# Patient Record
Sex: Female | Born: 1940 | Race: White | Hispanic: No | Marital: Single | Smoking: Current every day smoker
Health system: Southern US, Community
[De-identification: ages and names within clinical notes are randomized; demographics above are authoritative.]

## PROBLEM LIST (undated history)

## (undated) DIAGNOSIS — I10 Essential (primary) hypertension: Secondary | ICD-10-CM

## (undated) DIAGNOSIS — M8580 Other specified disorders of bone density and structure, unspecified site: Secondary | ICD-10-CM

## (undated) DIAGNOSIS — K649 Unspecified hemorrhoids: Secondary | ICD-10-CM

## (undated) DIAGNOSIS — F419 Anxiety disorder, unspecified: Secondary | ICD-10-CM

## (undated) DIAGNOSIS — K589 Irritable bowel syndrome without diarrhea: Secondary | ICD-10-CM

## (undated) DIAGNOSIS — E785 Hyperlipidemia, unspecified: Secondary | ICD-10-CM

## (undated) DIAGNOSIS — K469 Unspecified abdominal hernia without obstruction or gangrene: Secondary | ICD-10-CM

## (undated) HISTORY — PX: EYE SURGERY: SHX253

## (undated) HISTORY — DX: Other specified disorders of bone density and structure, unspecified site: M85.80

## (undated) HISTORY — DX: Anxiety disorder, unspecified: F41.9

## (undated) HISTORY — DX: Irritable bowel syndrome, unspecified: K58.9

## (undated) HISTORY — DX: Unspecified hemorrhoids: K64.9

## (undated) HISTORY — DX: Hyperlipidemia, unspecified: E78.5

## (undated) HISTORY — DX: Unspecified abdominal hernia without obstruction or gangrene: K46.9

## (undated) HISTORY — PX: NECK SURGERY: SHX720

---

## 2003-12-11 ENCOUNTER — Emergency Department (HOSPITAL_COMMUNITY): Admission: EM | Admit: 2003-12-11 | Discharge: 2003-12-11 | Payer: Self-pay | Admitting: Emergency Medicine

## 2005-06-11 ENCOUNTER — Emergency Department (HOSPITAL_COMMUNITY): Admission: EM | Admit: 2005-06-11 | Discharge: 2005-06-11 | Payer: Self-pay | Admitting: Emergency Medicine

## 2005-06-12 ENCOUNTER — Ambulatory Visit (HOSPITAL_COMMUNITY): Admission: RE | Admit: 2005-06-12 | Discharge: 2005-06-12 | Payer: Self-pay | Admitting: Emergency Medicine

## 2007-04-01 ENCOUNTER — Ambulatory Visit (HOSPITAL_COMMUNITY): Admission: RE | Admit: 2007-04-01 | Discharge: 2007-04-01 | Payer: Self-pay | Admitting: Family Medicine

## 2007-08-10 ENCOUNTER — Emergency Department (HOSPITAL_COMMUNITY): Admission: EM | Admit: 2007-08-10 | Discharge: 2007-08-10 | Payer: Self-pay | Admitting: Emergency Medicine

## 2007-08-26 ENCOUNTER — Encounter: Admission: RE | Admit: 2007-08-26 | Discharge: 2007-08-26 | Payer: Self-pay | Admitting: Orthopedic Surgery

## 2007-12-12 ENCOUNTER — Ambulatory Visit (HOSPITAL_COMMUNITY): Admission: RE | Admit: 2007-12-12 | Discharge: 2007-12-12 | Payer: Self-pay | Admitting: General Surgery

## 2008-12-17 ENCOUNTER — Ambulatory Visit (HOSPITAL_COMMUNITY): Admission: RE | Admit: 2008-12-17 | Discharge: 2008-12-17 | Payer: Self-pay | Admitting: General Surgery

## 2009-06-10 ENCOUNTER — Ambulatory Visit (HOSPITAL_COMMUNITY): Admission: RE | Admit: 2009-06-10 | Discharge: 2009-06-10 | Payer: Self-pay | Admitting: Family Medicine

## 2009-06-24 ENCOUNTER — Ambulatory Visit (HOSPITAL_COMMUNITY): Admission: RE | Admit: 2009-06-24 | Discharge: 2009-06-24 | Payer: Self-pay | Admitting: Family Medicine

## 2009-12-20 ENCOUNTER — Ambulatory Visit (HOSPITAL_COMMUNITY)
Admission: RE | Admit: 2009-12-20 | Discharge: 2009-12-20 | Payer: Self-pay | Source: Home / Self Care | Admitting: Hematology and Oncology

## 2010-12-22 ENCOUNTER — Ambulatory Visit (HOSPITAL_COMMUNITY)
Admission: RE | Admit: 2010-12-22 | Discharge: 2010-12-22 | Payer: Self-pay | Source: Home / Self Care | Attending: Family Medicine | Admitting: Family Medicine

## 2011-06-29 ENCOUNTER — Other Ambulatory Visit (HOSPITAL_COMMUNITY): Payer: Self-pay | Admitting: Family Medicine

## 2011-06-29 DIAGNOSIS — M8580 Other specified disorders of bone density and structure, unspecified site: Secondary | ICD-10-CM

## 2011-08-01 ENCOUNTER — Encounter (HOSPITAL_COMMUNITY): Payer: Self-pay

## 2011-08-01 ENCOUNTER — Other Ambulatory Visit (HOSPITAL_COMMUNITY): Payer: Self-pay | Admitting: Family Medicine

## 2011-08-01 ENCOUNTER — Other Ambulatory Visit (HOSPITAL_COMMUNITY): Payer: Self-pay

## 2011-08-01 ENCOUNTER — Ambulatory Visit (HOSPITAL_COMMUNITY)
Admission: RE | Admit: 2011-08-01 | Discharge: 2011-08-01 | Disposition: A | Discharge status: Home / Self Care | Payer: Medicare Other | Source: Ambulatory Visit | Attending: Family Medicine | Admitting: Family Medicine

## 2011-08-01 DIAGNOSIS — F17200 Nicotine dependence, unspecified, uncomplicated: Secondary | ICD-10-CM | POA: Insufficient documentation

## 2011-08-01 DIAGNOSIS — Z780 Asymptomatic menopausal state: Secondary | ICD-10-CM | POA: Insufficient documentation

## 2011-08-01 DIAGNOSIS — Z789 Other specified health status: Secondary | ICD-10-CM

## 2011-08-01 DIAGNOSIS — M899 Disorder of bone, unspecified: Secondary | ICD-10-CM | POA: Insufficient documentation

## 2011-08-01 DIAGNOSIS — J449 Chronic obstructive pulmonary disease, unspecified: Secondary | ICD-10-CM | POA: Insufficient documentation

## 2011-08-01 DIAGNOSIS — M8580 Other specified disorders of bone density and structure, unspecified site: Secondary | ICD-10-CM

## 2011-08-01 DIAGNOSIS — M949 Disorder of cartilage, unspecified: Secondary | ICD-10-CM | POA: Insufficient documentation

## 2011-08-01 DIAGNOSIS — J4489 Other specified chronic obstructive pulmonary disease: Secondary | ICD-10-CM | POA: Insufficient documentation

## 2011-09-08 LAB — I-STAT 8, (EC8 V) (CONVERTED LAB)
Acid-Base Excess: 6 — ABNORMAL HIGH
Bicarbonate: 34.4 — ABNORMAL HIGH
Potassium: 4.2
TCO2: 36
pCO2, Ven: 63.1 — ABNORMAL HIGH
pH, Ven: 7.344 — ABNORMAL HIGH

## 2012-01-09 ENCOUNTER — Other Ambulatory Visit (HOSPITAL_COMMUNITY): Payer: Self-pay | Admitting: Family Medicine

## 2012-01-09 DIAGNOSIS — Z139 Encounter for screening, unspecified: Secondary | ICD-10-CM

## 2012-01-11 ENCOUNTER — Ambulatory Visit (HOSPITAL_COMMUNITY)
Admission: RE | Admit: 2012-01-11 | Discharge: 2012-01-11 | Disposition: A | Discharge status: Home / Self Care | Payer: Medicare Other | Source: Ambulatory Visit | Attending: Family Medicine | Admitting: Family Medicine

## 2012-01-11 DIAGNOSIS — Z1231 Encounter for screening mammogram for malignant neoplasm of breast: Secondary | ICD-10-CM | POA: Insufficient documentation

## 2012-01-11 DIAGNOSIS — Z139 Encounter for screening, unspecified: Secondary | ICD-10-CM

## 2012-03-06 ENCOUNTER — Emergency Department (HOSPITAL_COMMUNITY)
Admission: EM | Admit: 2012-03-06 | Discharge: 2012-03-07 | Disposition: A | Discharge status: Home / Self Care | Payer: Medicare Other | Attending: Emergency Medicine | Admitting: Emergency Medicine

## 2012-03-06 ENCOUNTER — Emergency Department (HOSPITAL_COMMUNITY): Payer: Medicare Other

## 2012-03-06 ENCOUNTER — Encounter (HOSPITAL_COMMUNITY): Payer: Self-pay | Admitting: *Deleted

## 2012-03-06 DIAGNOSIS — R109 Unspecified abdominal pain: Secondary | ICD-10-CM | POA: Insufficient documentation

## 2012-03-06 DIAGNOSIS — R110 Nausea: Secondary | ICD-10-CM | POA: Insufficient documentation

## 2012-03-06 DIAGNOSIS — R319 Hematuria, unspecified: Secondary | ICD-10-CM | POA: Insufficient documentation

## 2012-03-06 DIAGNOSIS — Z79899 Other long term (current) drug therapy: Secondary | ICD-10-CM | POA: Insufficient documentation

## 2012-03-06 DIAGNOSIS — R339 Retention of urine, unspecified: Secondary | ICD-10-CM | POA: Insufficient documentation

## 2012-03-06 LAB — URINALYSIS, ROUTINE W REFLEX MICROSCOPIC
Ketones, ur: NEGATIVE mg/dL
Leukocytes, UA: NEGATIVE
Nitrite: NEGATIVE
Urobilinogen, UA: 0.2 mg/dL (ref 0.0–1.0)
pH: 5.5 (ref 5.0–8.0)

## 2012-03-06 LAB — COMPREHENSIVE METABOLIC PANEL
Alkaline Phosphatase: 79 U/L (ref 39–117)
BUN: 19 mg/dL (ref 6–23)
Calcium: 11 mg/dL — ABNORMAL HIGH (ref 8.4–10.5)
GFR calc Af Amer: 83 mL/min — ABNORMAL LOW (ref >90)
GFR calc non Af Amer: 71 mL/min — ABNORMAL LOW (ref >90)
Glucose, Bld: 135 mg/dL — ABNORMAL HIGH (ref 70–99)
Potassium: 3.9 mEq/L (ref 3.5–5.1)
Total Protein: 8.3 g/dL (ref 6.0–8.3)

## 2012-03-06 LAB — URINE MICROSCOPIC-ADD ON

## 2012-03-06 LAB — DIFFERENTIAL
Eosinophils Absolute: 0 10*3/uL (ref 0.0–0.7)
Eosinophils Relative: 0 % (ref 0–5)
Lymphs Abs: 2.3 10*3/uL (ref 0.7–4.0)
Monocytes Relative: 6 % (ref 3–12)

## 2012-03-06 LAB — CBC
HCT: 48.1 % — ABNORMAL HIGH (ref 36.0–46.0)
Hemoglobin: 15.9 g/dL — ABNORMAL HIGH (ref 12.0–15.0)
MCH: 30.1 pg (ref 26.0–34.0)
MCV: 90.9 fL (ref 78.0–100.0)
RBC: 5.29 MIL/uL — ABNORMAL HIGH (ref 3.87–5.11)

## 2012-03-06 MED ORDER — CIPROFLOXACIN IN D5W 400 MG/200ML IV SOLN
400.0000 mg | Freq: Once | INTRAVENOUS | Status: AC
  Administered 2012-03-07: 400 mg via INTRAVENOUS
  Filled 2012-03-06: qty 200

## 2012-03-06 MED ORDER — SODIUM CHLORIDE 0.9 % IV BOLUS (SEPSIS)
1000.0000 mL | Freq: Once | INTRAVENOUS | Status: AC
  Administered 2012-03-06: 1000 mL via INTRAVENOUS

## 2012-03-06 MED ORDER — ONDANSETRON HCL 4 MG/2ML IJ SOLN
4.0000 mg | Freq: Once | INTRAMUSCULAR | Status: AC
  Administered 2012-03-07: 4 mg via INTRAVENOUS
  Filled 2012-03-06: qty 2

## 2012-03-06 MED ORDER — SODIUM CHLORIDE 0.9 % IV SOLN: INTRAVENOUS | Status: DC

## 2012-03-06 NOTE — ED Provider Notes (Signed)
History     CSN: [id number]  Arrival date & time 03/06/12  1909   First MD Initiated Contact with Patient 03/06/12 2312      Chief Complaint  Patient presents with  . Abdominal Pain    (Consider location/radiation/quality/duration/timing/severity/associated sxs/prior treatment) HPI Valerie Marshall is a 71 y.o. female who presents to the Emergency Department complaining of Lower abdominal pain that began last night and got worse today. It has been associated with urinary retention. Patient had cataract surgery today and has only voided once, a small amount, since surgery. She has experienced mild nausea, no vomiting. She denies fever, chills, cough, shortness of breath.  PCP Dr. Renard Matter Opthalmologist Dr. Nile Riggs   History reviewed. No pertinent past medical history.  History reviewed. No pertinent past surgical history.  No family history on file.  History  Substance Use Topics  . Smoking status: Not on file  . Smokeless tobacco: Not on file  . Alcohol Use: Not on file    OB History    Grav Para Term Preterm Abortions TAB SAB Ect Mult Living                  Review of Systems ROS: Statement: All systems negative except as marked or noted in the HPI; Constitutional: Negative for fever and chills. ; ; Eyes: Negative for eye pain, redness and discharge. ; ; ENMT: Negative for ear pain, hoarseness, nasal congestion, sinus pressure and sore throat. ; ; Cardiovascular: Negative for chest pain, palpitations, diaphoresis, dyspnea and peripheral edema. ; ; Respiratory: Negative for cough, wheezing and stridor. ; ; Gastrointestinal: Negative for  vomiting, diarrhea, abdominal pain, blood in stool, hematemesis, jaundice and rectal bleeding. . ; ; Genitourinary: Negative for dysuria, flank pain and hematuria. ; ; Musculoskeletal: Negative for back pain and neck pain. Negative for swelling and trauma.; ; Skin: Negative for pruritus, rash, abrasions, blisters, bruising and skin lesion.;  ; Neuro: Negative for headache, lightheadedness and neck stiffness. Negative for weakness, altered level of consciousness , altered mental status, extremity weakness, paresthesias, involuntary movement, seizure and syncope.     Allergies  Bee venom and Codeine  Home Medications   Current Outpatient Rx  Name Route Sig Dispense Refill  . ACETAMINOPHEN 500 MG PO TABS Oral Take 500 mg by mouth every 6 (six) hours as needed. For eye pain    . DOXYCYCLINE HYCLATE 100 MG PO TABS Oral Take 100 mg by mouth 2 (two) times daily. On an empty stomach    . FENOFIBRATE MICRONIZED 130 MG PO CAPS Oral Take 130 mg by mouth daily before breakfast.    . FUROSEMIDE 40 MG PO TABS Oral Take 40 mg by mouth daily.    Marland Kitchen ROSUVASTATIN CALCIUM 20 MG PO TABS Oral Take 20 mg by mouth daily.      BP 151/67  Pulse 90  Temp(Src) 98 F (36.7 C) (Oral)  Resp 16  SpO2 96%  Physical Exam Physical examination:  Nursing notes reviewed; Vital signs and O2 SAT reviewed;  Constitutional: Well developed, Well nourished, Well hydrated, In no acute distress; Head:  Normocephalic, atraumatic; Eyes: EOMI, PERRL, No scleral icterus;Eye shield over left eye ENMT: Mouth and pharynx normal, Mucous membranes moist; Neck: Supple, Full range of motion, No lymphadenopathy; Cardiovascular: Regular rate and rhythm, No murmur, rub, or gallop; Respiratory: Breath sounds clear & equal bilaterally, No rales, rhonchi, wheezes, or rub, Normal respiratory effort/excursion; Chest: Nontender, Movement normal; Abdomen: Soft, mild suprapubic tenderness to palpation,, Nondistended, Normal bowel  sounds; Genitourinary: No CVA tenderness; Extremities: Pulses normal, No tenderness, No edema, No calf edema or asymmetry.; Neuro: AA&Ox3, Major CN grossly intact.  No gross focal motor or sensory deficits in extremities.; Skin: Color normal, Warm, Dry  ED Course  Procedures (including critical care time)  Results for orders placed during the hospital encounter of  03/06/12  CBC      Component Value Range   WBC 14.4 (*) 4.0 - 10.5 (K/uL)   RBC 5.29 (*) 3.87 - 5.11 (MIL/uL)   Hemoglobin 15.9 (*) 12.0 - 15.0 (g/dL)   HCT 40.9 (*) 81.1 - 46.0 (%)   MCV 90.9  78.0 - 100.0 (fL)   MCH 30.1  26.0 - 34.0 (pg)   MCHC 33.1  30.0 - 36.0 (g/dL)   RDW 91.4  78.2 - 95.6 (%)   Platelets 298  150 - 400 (K/uL)  DIFFERENTIAL      Component Value Range   Neutrophils Relative 78 (*) 43 - 77 (%)   Neutro Abs 11.3 (*) 1.7 - 7.7 (K/uL)   Lymphocytes Relative 16  12 - 46 (%)   Lymphs Abs 2.3  0.7 - 4.0 (K/uL)   Monocytes Relative 6  3 - 12 (%)   Monocytes Absolute 0.8  0.1 - 1.0 (K/uL)   Eosinophils Relative 0  0 - 5 (%)   Eosinophils Absolute 0.0  0.0 - 0.7 (K/uL)   Basophils Relative 0  0 - 1 (%)   Basophils Absolute 0.0  0.0 - 0.1 (K/uL)  COMPREHENSIVE METABOLIC PANEL      Component Value Range   Sodium 138  135 - 145 (mEq/L)   Potassium 3.9  3.5 - 5.1 (mEq/L)   Chloride 100  96 - 112 (mEq/L)   CO2 26  19 - 32 (mEq/L)   Glucose, Bld 135 (*) 70 - 99 (mg/dL)   BUN 19  6 - 23 (mg/dL)   Creatinine, Ser 2.13  0.50 - 1.10 (mg/dL)   Calcium 08.6 (*) 8.4 - 10.5 (mg/dL)   Total Protein 8.3  6.0 - 8.3 (g/dL)   Albumin 4.3  3.5 - 5.2 (g/dL)   AST 25  0 - 37 (U/L)   ALT 19  0 - 35 (U/L)   Alkaline Phosphatase 79  39 - 117 (U/L)   Total Bilirubin 0.5  0.3 - 1.2 (mg/dL)   GFR calc non Af Amer 71 (*) >90 (mL/min)   GFR calc Af Amer 83 (*) >90 (mL/min)  URINALYSIS, ROUTINE W REFLEX MICROSCOPIC      Component Value Range   Color, Urine YELLOW  YELLOW    APPearance CLEAR  CLEAR    Specific Gravity, Urine >1.030 (*) 1.005 - 1.030    pH 5.5  5.0 - 8.0    Glucose, UA NEGATIVE  NEGATIVE (mg/dL)   Hgb urine dipstick LARGE (*) NEGATIVE    Bilirubin Urine NEGATIVE  NEGATIVE    Ketones, ur NEGATIVE  NEGATIVE (mg/dL)   Protein, ur NEGATIVE  NEGATIVE (mg/dL)   Urobilinogen, UA 0.2  0.0 - 1.0 (mg/dL)   Nitrite NEGATIVE  NEGATIVE    Leukocytes, UA NEGATIVE  NEGATIVE     LIPASE, BLOOD      Component Value Range   Lipase 40  11 - 59 (U/L)  URINE MICROSCOPIC-ADD ON      Component Value Range   Squamous Epithelial / LPF RARE  RARE    WBC, UA 0-2  <3 (WBC/hpf)   RBC / HPF TOO NUMEROUS TO COUNT  <  3 (RBC/hpf)   Bacteria, UA FEW (*) RARE    Crystals CA OXALATE CRYSTALS (*) NEGATIVE    Dg Abd Acute W/chest  03/06/2012  *RADIOLOGY REPORT*  Clinical Data: Chest discomfort and diffuse abdominal pain.  ACUTE ABDOMEN SERIES (ABDOMEN 2 VIEW & CHEST 1 VIEW)  Comparison: Chest radiograph performed 08/01/2011  Findings: The lungs are hyperexpanded, with flattening of the hemidiaphragms, compatible with COPD.  Chronically increased interstitial markings are noted.  There is no evidence of focal opacification, pleural effusion or pneumothorax.  The cardiomediastinal silhouette is normal in size; calcification is noted at the aortic arch.  The visualized bowel gas pattern is unremarkable.  Scattered stool and air are seen within the colon; there is no evidence of small bowel dilatation to suggest obstruction.  No free intra-abdominal air is identified on the provided upright view.  No acute osseous abnormalities are seen; the sacroiliac joints are unremarkable in appearance.  IMPRESSION:  1.  Unremarkable bowel gas pattern; no free intra-abdominal air seen. 2.  No acute cardiopulmonary process identified; findings of COPD again noted.  Original Report Authenticated By: Tonia Ghent, M.D.       MDM  Patient with lower abdominal pain and urinary retention since last night. Patient had cataract surgery earlier today. Labs with a elevated white count and left shift and urine with a large amount of blood. Patient has no flank pain to suggest a kidney stone. We will treat empirically with antibiotics, and patient will followup with her primary care physician. Given IV fluids, analgesics, enzymatic, and antibiotic. Patient feels better.Pt stable in ED with no significant deterioration in  condition.The patient appears reasonably screened and/or stabilized for discharge and I doubt any other medical condition or other [facility] requiring further screening, evaluation, or treatment in the ED at this time prior to discharge.  MDM Reviewed: previous chart, nursing note and vitals Interpretation: labs          Nicoletta Dress. Colon Branch, MD 03/07/12 0110

## 2012-03-06 NOTE — ED Notes (Signed)
Abdominal pain , states she has swelling but decreased urine output

## 2012-03-07 MED ORDER — CIPROFLOXACIN HCL 250 MG PO TABS: 250.0000 mg | ORAL_TABLET | Freq: Two times a day (BID) | ORAL | Status: AC

## 2012-03-07 MED ORDER — ONDANSETRON HCL 4 MG PO TABS: 4.0000 mg | ORAL_TABLET | Freq: Four times a day (QID) | ORAL | Status: AC

## 2012-03-07 NOTE — Discharge Instructions (Signed)
YOur lab work here tonight shows blood in the urine which is most likely the beginning of a urinary tract infection. We have given you antibiotics. Drink lots of fluids. Take all of the antibiotic. Use the nausea medicine as needed. Follow up with Dr. Renard Matter.

## 2012-03-07 NOTE — ED Notes (Signed)
Pt alert & oriented x4, stable gait. Pt given discharge instructions, paperwork & prescription(s). Patient instructed to stop at the registration desk to finish any additional paperwork. pt verbalized understanding. Pt left department w/ no further questions.  

## 2012-08-21 ENCOUNTER — Other Ambulatory Visit (HOSPITAL_COMMUNITY): Payer: Self-pay | Admitting: Family Medicine

## 2012-08-21 ENCOUNTER — Ambulatory Visit (HOSPITAL_COMMUNITY)
Admission: RE | Admit: 2012-08-21 | Discharge: 2012-08-21 | Disposition: A | Discharge status: Home / Self Care | Payer: Medicare Other | Source: Ambulatory Visit | Attending: Family Medicine | Admitting: Family Medicine

## 2012-08-21 DIAGNOSIS — G8929 Other chronic pain: Secondary | ICD-10-CM

## 2012-08-21 DIAGNOSIS — M25569 Pain in unspecified knee: Secondary | ICD-10-CM | POA: Insufficient documentation

## 2012-10-02 ENCOUNTER — Other Ambulatory Visit (HOSPITAL_COMMUNITY): Payer: Self-pay | Admitting: Family Medicine

## 2012-10-02 ENCOUNTER — Ambulatory Visit (HOSPITAL_COMMUNITY)
Admission: RE | Admit: 2012-10-02 | Discharge: 2012-10-02 | Disposition: A | Discharge status: Home / Self Care | Payer: Medicare Other | Source: Ambulatory Visit | Attending: Family Medicine | Admitting: Family Medicine

## 2012-10-02 DIAGNOSIS — F17200 Nicotine dependence, unspecified, uncomplicated: Secondary | ICD-10-CM

## 2012-12-31 ENCOUNTER — Other Ambulatory Visit (HOSPITAL_COMMUNITY): Payer: Self-pay | Admitting: Family Medicine

## 2012-12-31 DIAGNOSIS — Z139 Encounter for screening, unspecified: Secondary | ICD-10-CM

## 2013-01-13 ENCOUNTER — Ambulatory Visit (HOSPITAL_COMMUNITY)
Admission: RE | Admit: 2013-01-13 | Discharge: 2013-01-13 | Disposition: A | Discharge status: Home / Self Care | Payer: Medicare Other | Source: Ambulatory Visit | Attending: Family Medicine | Admitting: Family Medicine

## 2013-01-13 DIAGNOSIS — Z139 Encounter for screening, unspecified: Secondary | ICD-10-CM

## 2013-01-13 DIAGNOSIS — Z1231 Encounter for screening mammogram for malignant neoplasm of breast: Secondary | ICD-10-CM | POA: Insufficient documentation

## 2013-01-17 ENCOUNTER — Other Ambulatory Visit: Payer: Self-pay | Admitting: Family Medicine

## 2013-01-17 ENCOUNTER — Other Ambulatory Visit (HOSPITAL_COMMUNITY): Payer: Self-pay | Admitting: Family Medicine

## 2013-01-17 DIAGNOSIS — R928 Other abnormal and inconclusive findings on diagnostic imaging of breast: Secondary | ICD-10-CM

## 2013-01-29 ENCOUNTER — Other Ambulatory Visit (HOSPITAL_COMMUNITY): Payer: Self-pay | Admitting: Family Medicine

## 2013-01-29 ENCOUNTER — Ambulatory Visit (HOSPITAL_COMMUNITY)
Admission: RE | Admit: 2013-01-29 | Discharge: 2013-01-29 | Disposition: A | Discharge status: Home / Self Care | Payer: Medicare Other | Source: Ambulatory Visit | Attending: Family Medicine | Admitting: Family Medicine

## 2013-01-29 DIAGNOSIS — R928 Other abnormal and inconclusive findings on diagnostic imaging of breast: Secondary | ICD-10-CM | POA: Insufficient documentation

## 2014-03-09 ENCOUNTER — Ambulatory Visit (HOSPITAL_COMMUNITY)
Admission: RE | Admit: 2014-03-09 | Discharge: 2014-03-09 | Disposition: A | Discharge status: Home / Self Care | Payer: Medicare Other | Source: Ambulatory Visit | Attending: Family Medicine | Admitting: Family Medicine

## 2014-03-09 ENCOUNTER — Other Ambulatory Visit (HOSPITAL_COMMUNITY): Payer: Self-pay | Admitting: Family Medicine

## 2014-03-09 DIAGNOSIS — F17200 Nicotine dependence, unspecified, uncomplicated: Secondary | ICD-10-CM

## 2014-07-16 ENCOUNTER — Encounter: Payer: Self-pay | Admitting: Internal Medicine

## 2014-09-10 ENCOUNTER — Encounter: Payer: Self-pay | Admitting: Internal Medicine

## 2014-09-15 ENCOUNTER — Ambulatory Visit: Payer: Medicare Other | Admitting: Internal Medicine

## 2014-11-13 ENCOUNTER — Ambulatory Visit (INDEPENDENT_AMBULATORY_CARE_PROVIDER_SITE_OTHER): Payer: Medicare Other | Admitting: Internal Medicine

## 2014-11-13 ENCOUNTER — Encounter: Payer: Self-pay | Admitting: Internal Medicine

## 2014-11-13 VITALS — BP 158/84 | HR 100 | Ht 63.5 in | Wt 172.5 lb

## 2014-11-13 DIAGNOSIS — R634 Abnormal weight loss: Secondary | ICD-10-CM

## 2014-11-13 DIAGNOSIS — Z1211 Encounter for screening for malignant neoplasm of colon: Secondary | ICD-10-CM

## 2014-11-13 NOTE — Patient Instructions (Addendum)
Cologuard stool test was ordered today. You will receive a call from [facility] who administers the test with instructions on how to do the stool test and mail it back to them.  Information on Cologuard was given today please read over it.   I appreciate the opportunity to care for you.

## 2014-11-13 NOTE — Progress Notes (Signed)
Patient ID: Valerie RandyDorothy G Marshall, female   DOB: 06-May-1941, 73 y.o.   MRN: [id number]    HPI:    Valerie CellaDorothy is a 73 year old female who was self-referred for colon cancer screening. She has not had any change in her bowel habits or stool caliber. She was diagnosed with IBS 40 years ago at another practice and states she tries to watch what she eats to regulate her stools. She has had no bright red blood per rectum or melena. Her appetite is good. She has lost 28 pounds over the past year, but she states she used to drink two 2 L bottles of Dr. Reino KentPepper daily and eat junk food. Last January she eliminated all soda, junk food, bread, and pasta from her diet. She has had no heartburn, epigastric pain, nausea, vomiting, or dysphagia. There is no known family history of colon cancer, colon polyps, or inflammatory bowel disease.   Past Medical History  Diagnosis Date  . Hyperlipidemia   . Osteopenia   . Anxiety   . Irritable bowel syndrome   . Hernia of abdominal cavity   . Hemorrhoids     Past Surgical History  Procedure Laterality Date  . Neck surgery     Family History  Problem Relation Age of Onset  . Breast cancer Sister   . Diabetes Sister   . Colon cancer Neg Hx   . Colon polyps Neg Hx   . Kidney disease Neg Hx   . Esophageal cancer Neg Hx   . Gallbladder disease Neg Hx    History  Substance Use Topics  . Smoking status: Current Every Day Smoker -- 0.50 packs/day for 45 years    Types: Cigarettes  . Smokeless tobacco: Never Used  . Alcohol Use: No   Current Outpatient Prescriptions  Medication Sig Dispense Refill  . acetaminophen (TYLENOL) 500 MG tablet Take 500 mg by mouth every 6 (six) hours as needed. For eye pain    . clorazepate (TRANXENE) 7.5 MG tablet Take 7.5 mg by mouth daily.    . fenofibrate (TRICOR) 145 MG tablet     . furosemide (LASIX) 40 MG tablet Take 40 mg by mouth as needed.     . rosuvastatin (CRESTOR) 20 MG tablet Take 20 mg by mouth daily.     No current  facility-administered medications for this visit.   Allergies  Allergen Reactions  . Bee Venom Swelling  . Codeine Other (See Comments)    Nervousness, Hallucinations     Review of Systems: Gen: Denies any fever, chills, sweats, anorexia, fatigue, weakness, malaise, and sleep disorder CV: Denies chest pain, angina, palpitations, syncope, orthopnea, PND, peripheral edema, and claudication. Resp: Denies dyspnea at rest, dyspnea with exercise, cough, sputum, wheezing, coughing up blood, and pleurisy. GI: Denies vomiting blood, jaundice, and fecal incontinence.   Denies dysphagia or odynophagia. GU : Denies urinary burning, blood in urine, urinary frequency, urinary hesitancy, nocturnal urination, and urinary incontinence. MS: Denies joint pain, limitation of movement, and swelling, stiffness, low back pain, extremity pain. Denies muscle weakness, cramps, atrophy.  Derm: Denies rash, itching, dry skin, hives, moles, warts, or unhealing ulcers.  Psych: Denies depression, anxiety, memory loss, suicidal ideation, hallucinations, paranoia, and confusion. Heme: Denies bruising, bleeding, and enlarged lymph nodes. Neuro:  Denies any headaches, dizziness, paresthesias. Endo:  Denies any problems with DM, thyroid, adrenal function    Physical Exam: BP 158/84 mmHg  Pulse 100  Ht 5' 3.5" (1.613 m)  Wt 172 lb 8 oz (78.245 kg)  BMI 30.07 kg/m2 Constitutional: Pleasant,well-developed female in no acute distress. HEENT: Normocephalic and atraumatic. Conjunctivae are normal. No scleral icterus. Neck supple.  Cardiovascular: Normal rate, regular rhythm.  Pulmonary/chest: Effort normal and breath sounds normal. No wheezing, rales or rhonchi. Abdominal: Soft, nondistended, nontender. Bowel sounds active throughout. There are no masses palpable. No hepatomegaly. Extremities: no edema Lymphadenopathy: No cervical adenopathy noted. Neurological: Alert and oriented to person place and time. Skin: Skin  is warm and dry. No rashes noted. Psychiatric: Normal mood and affect. Behavior is normal.  ASSESSMENT AND PLAN: Asymptomatic 73 year old female who has experienced purposeful weight loss over the past year, here to discuss colorectal cancer screening. We have discussed the options of traditional colonoscopy versus cold guard. She states her brother-in-law recently did colo guard, and she prefers to have the colo guard test. She understands that if the colo guard is positive, she will need to proceed with a traditional colonoscopy. She also understands that if the colo guard is negative, she would likely need to repeat it in 3 or 4 years.    Ashlee Player, Tollie PizzaLori P PA-C 11/13/2014, 2:09 PM    Agree w/ Ms. Valerie Marshall's note and mangement.        Iva Booparl E. Gessner, MD, Clementeen GrahamFACG

## 2015-05-25 ENCOUNTER — Other Ambulatory Visit (HOSPITAL_COMMUNITY): Payer: Self-pay | Admitting: Family Medicine

## 2015-05-25 DIAGNOSIS — Z1231 Encounter for screening mammogram for malignant neoplasm of breast: Secondary | ICD-10-CM

## 2015-06-03 ENCOUNTER — Ambulatory Visit (HOSPITAL_COMMUNITY)
Admission: RE | Admit: 2015-06-03 | Discharge: 2015-06-03 | Disposition: A | Discharge status: Home / Self Care | Payer: Medicare Other | Source: Ambulatory Visit | Attending: Family Medicine | Admitting: Family Medicine

## 2015-06-03 ENCOUNTER — Other Ambulatory Visit (HOSPITAL_COMMUNITY): Payer: Self-pay | Admitting: Family Medicine

## 2015-06-03 DIAGNOSIS — F17210 Nicotine dependence, cigarettes, uncomplicated: Secondary | ICD-10-CM | POA: Insufficient documentation

## 2015-06-03 DIAGNOSIS — Z1231 Encounter for screening mammogram for malignant neoplasm of breast: Secondary | ICD-10-CM

## 2015-06-03 DIAGNOSIS — J449 Chronic obstructive pulmonary disease, unspecified: Secondary | ICD-10-CM | POA: Diagnosis not present

## 2016-01-13 ENCOUNTER — Other Ambulatory Visit (HOSPITAL_COMMUNITY): Payer: Self-pay | Admitting: Internal Medicine

## 2016-01-13 ENCOUNTER — Ambulatory Visit (HOSPITAL_COMMUNITY)
Admission: RE | Admit: 2016-01-13 | Discharge: 2016-01-13 | Disposition: A | Discharge status: Home / Self Care | Payer: Medicare Other | Source: Ambulatory Visit | Attending: Internal Medicine | Admitting: Internal Medicine

## 2016-01-13 DIAGNOSIS — M8580 Other specified disorders of bone density and structure, unspecified site: Secondary | ICD-10-CM

## 2016-01-13 DIAGNOSIS — M85852 Other specified disorders of bone density and structure, left thigh: Secondary | ICD-10-CM | POA: Diagnosis present

## 2016-05-31 ENCOUNTER — Other Ambulatory Visit (HOSPITAL_COMMUNITY): Payer: Self-pay | Admitting: Internal Medicine

## 2016-05-31 DIAGNOSIS — Z1231 Encounter for screening mammogram for malignant neoplasm of breast: Secondary | ICD-10-CM

## 2016-06-07 ENCOUNTER — Ambulatory Visit (HOSPITAL_COMMUNITY)
Admission: RE | Admit: 2016-06-07 | Discharge: 2016-06-07 | Disposition: A | Discharge status: Home / Self Care | Payer: Medicare Other | Source: Ambulatory Visit | Attending: Internal Medicine | Admitting: Internal Medicine

## 2016-06-07 DIAGNOSIS — Z1231 Encounter for screening mammogram for malignant neoplasm of breast: Secondary | ICD-10-CM

## 2017-04-26 ENCOUNTER — Other Ambulatory Visit (HOSPITAL_COMMUNITY): Payer: Self-pay | Admitting: Internal Medicine

## 2017-04-26 DIAGNOSIS — Z1231 Encounter for screening mammogram for malignant neoplasm of breast: Secondary | ICD-10-CM

## 2017-05-16 ENCOUNTER — Emergency Department (HOSPITAL_COMMUNITY)
Admission: EM | Admit: 2017-05-16 | Discharge: 2017-05-16 | Disposition: A | Discharge status: Home Health | Payer: Medicare Other | Attending: Emergency Medicine | Admitting: Emergency Medicine

## 2017-05-16 ENCOUNTER — Encounter (HOSPITAL_COMMUNITY): Payer: Self-pay | Admitting: Emergency Medicine

## 2017-05-16 ENCOUNTER — Emergency Department (HOSPITAL_COMMUNITY): Payer: Medicare Other

## 2017-05-16 DIAGNOSIS — S300XXA Contusion of lower back and pelvis, initial encounter: Secondary | ICD-10-CM | POA: Insufficient documentation

## 2017-05-16 DIAGNOSIS — Y929 Unspecified place or not applicable: Secondary | ICD-10-CM | POA: Insufficient documentation

## 2017-05-16 DIAGNOSIS — Y999 Unspecified external cause status: Secondary | ICD-10-CM | POA: Diagnosis not present

## 2017-05-16 DIAGNOSIS — M5136 Other intervertebral disc degeneration, lumbar region: Secondary | ICD-10-CM | POA: Insufficient documentation

## 2017-05-16 DIAGNOSIS — Y939 Activity, unspecified: Secondary | ICD-10-CM | POA: Insufficient documentation

## 2017-05-16 DIAGNOSIS — W19XXXA Unspecified fall, initial encounter: Secondary | ICD-10-CM | POA: Diagnosis not present

## 2017-05-16 DIAGNOSIS — F17210 Nicotine dependence, cigarettes, uncomplicated: Secondary | ICD-10-CM | POA: Insufficient documentation

## 2017-05-16 DIAGNOSIS — M47817 Spondylosis without myelopathy or radiculopathy, lumbosacral region: Secondary | ICD-10-CM

## 2017-05-16 DIAGNOSIS — I10 Essential (primary) hypertension: Secondary | ICD-10-CM | POA: Diagnosis not present

## 2017-05-16 DIAGNOSIS — S3992XA Unspecified injury of lower back, initial encounter: Secondary | ICD-10-CM | POA: Diagnosis present

## 2017-05-16 HISTORY — DX: Essential (primary) hypertension: I10

## 2017-05-16 MED ORDER — METHOCARBAMOL 500 MG PO TABS
500.0000 mg | ORAL_TABLET | Freq: Once | ORAL | Status: AC
  Administered 2017-05-16: 500 mg via ORAL
  Filled 2017-05-16: qty 1

## 2017-05-16 MED ORDER — ONDANSETRON HCL 4 MG PO TABS
4.0000 mg | ORAL_TABLET | Freq: Once | ORAL | Status: AC
  Administered 2017-05-16: 4 mg via ORAL
  Filled 2017-05-16: qty 1

## 2017-05-16 MED ORDER — METHOCARBAMOL 500 MG PO TABS: 500.0000 mg | ORAL_TABLET | Freq: Two times a day (BID) | ORAL | 0 refills | Status: DC

## 2017-05-16 MED ORDER — HYDROCODONE-ACETAMINOPHEN 5-325 MG PO TABS: 1.0000 | ORAL_TABLET | Freq: Four times a day (QID) | ORAL | 0 refills | Status: DC | PRN

## 2017-05-16 MED ORDER — MELOXICAM 7.5 MG PO TABS: 7.5000 mg | ORAL_TABLET | Freq: Two times a day (BID) | ORAL | 0 refills | Status: DC

## 2017-05-16 MED ORDER — HYDROCODONE-ACETAMINOPHEN 5-325 MG PO TABS
2.0000 | ORAL_TABLET | Freq: Once | ORAL | Status: AC
  Administered 2017-05-16: 2 via ORAL
  Filled 2017-05-16: qty 2

## 2017-05-16 NOTE — ED Triage Notes (Signed)
Pt fell a week ago on her buttocks. Pt states " my tailbone is hurting" no head injury.

## 2017-05-16 NOTE — Discharge Instructions (Signed)
Your x-rays are negative for fracture or dislocation. There are multiple areas of arthritis and degenerative disc disease. Your examination is consistent with contusion or bruising to your back following the fall. Please discuss her symptoms with Dr. Selena BattenKim. The two-view can then decide if an MRI is needed. Please use Mobic 2 times daily with a meal. Use Robaxin 2 times daily, use Norco for more severe pain. Norco and Robaxin may cause drowsiness, please use these medications with caution.

## 2017-05-16 NOTE — ED Provider Notes (Signed)
[facility]-EM[facility]Y DEPT Provider Note   CSN: [id number] Arrival date & time: 05/16/17  1356     History   Chief Complaint Chief Complaint  Patient presents with  . Fall    HPI Valerie Marshall is a 76 y.o. female.  Patient is a 76 year old female who presents to the emergency department with a complaint of back pain following a fall.  The patient states that approximately a week ago she fell from a standing position and fell on her buttocks. She states since that time her quotation marks tailbone" has been hurting. She did not hit her head or have any other injury at the time. She did not see a physician right away. She did see a physician a few days ago and was given medication for pain. The patient states she is still having discomfort and the pain medicine helps very minimally. She wanted to be evaluated, she wanted to have x-rays, and to be examined. His been no loss of bowel or bladder function. No other falls reported since the fall over a week ago.      Past Medical History:  Diagnosis Date  . Anxiety   . Hemorrhoids   . Hernia of abdominal cavity   . Hyperlipidemia   . Hypertension   . Irritable bowel syndrome   . Osteopenia     There are no active problems to display for this patient.   Past Surgical History:  Procedure Laterality Date  . NECK SURGERY      OB History    No data available       Home Medications    Prior to Admission medications   Medication Sig Start Date End Date Taking? Authorizing Provider  acetaminophen (TYLENOL) 500 MG tablet Take 500 mg by mouth every 6 (six) hours as needed. For eye pain    [provider]  amLODipine (NORVASC) 2.5 MG tablet Take 2.5 mg by mouth daily. 04/05/17   [provider]  clorazepate (TRANXENE) 7.5 MG tablet Take 7.5 mg by mouth daily.    [provider]  fenofibrate (TRICOR) 145 MG tablet  08/17/14   [provider]  furosemide (LASIX) 40 MG tablet Take 40 mg by mouth as  needed.     [provider]  hydrochlorothiazide (HYDRODIURIL) 25 MG tablet Take 25 mg by mouth daily. 03/01/17   [provider]  HYDROcodone-acetaminophen (NORCO/VICODIN) 5-325 MG tablet Take 1 tablet by mouth 2 (two) times daily as needed. 05/11/17   [provider]  lisinopril (PRINIVIL,ZESTRIL) 20 MG tablet Take 20 mg by mouth daily. 05/09/17   [provider]  lisinopril (PRINIVIL,ZESTRIL) 5 MG tablet Take 5 mg by mouth daily. 05/09/17   [provider]  rosuvastatin (CRESTOR) 20 MG tablet Take 20 mg by mouth daily.    [provider]    Family History Family History  Problem Relation Age of Onset  . Breast cancer Sister   . Diabetes Sister   . Colon cancer Neg Hx   . Colon polyps Neg Hx   . Kidney disease Neg Hx   . Esophageal cancer Neg Hx   . Gallbladder disease Neg Hx     Social History Social History  Substance Use Topics  . Smoking status: Current Every Day Smoker    Packs/day: 0.50    Years: 45.00    Types: Cigarettes  . Smokeless tobacco: Never Used  . Alcohol use No     Allergies   Bee venom and Codeine  Review of Systems Review of Systems  Constitutional: Negative for activity change.       All ROS Neg except as noted in HPI  HENT: Negative for nosebleeds.   Eyes: Negative for photophobia and discharge.  Respiratory: Negative for cough, shortness of breath and wheezing.   Cardiovascular: Negative for chest pain and palpitations.  Gastrointestinal: Negative for abdominal pain and blood in stool.  Genitourinary: Negative for dysuria, frequency and hematuria.  Musculoskeletal: Positive for arthralgias and back pain. Negative for neck pain.  Skin: Negative.   Neurological: Negative for dizziness, seizures and speech difficulty.  Psychiatric/Behavioral: Negative for confusion and hallucinations.     Physical Exam Updated Vital Signs BP (!) 153/91 (BP Location: Right Arm)   Pulse 96   Temp 98.3 F  (36.8 C) (Oral)   Resp 18   Ht 5\' 6"  (1.676 m)   Wt 78.5 kg (173 lb)   SpO2 98%   BMI 27.92 kg/m   Physical Exam  Constitutional: She is oriented to person, place, and time. She appears well-developed and well-nourished.  Non-toxic appearance.  HENT:  Head: Normocephalic.  Right Ear: Tympanic membrane and external ear normal.  Left Ear: Tympanic membrane and external ear normal.  Eyes: EOM and lids are normal. Pupils are equal, round, and reactive to light.  Neck: Normal range of motion. Neck supple. Carotid bruit is not present.  Cardiovascular: Normal rate, regular rhythm, intact distal pulses and normal pulses.  Exam reveals no friction rub.   Murmur heard. Pulmonary/Chest: Breath sounds normal. No respiratory distress.  Abdominal: Soft. Bowel sounds are normal. There is no tenderness. There is no guarding.  Musculoskeletal: Normal range of motion.  There is pain to palpation of the lower lumbar spine. There is pain to palpation over the coccyx area. There is pain and some mild spasm of the paraspinal region at the lumbar area. No hot areas appreciated.  Lymphadenopathy:       Head (right side): No submandibular adenopathy present.       Head (left side): No submandibular adenopathy present.    She has no cervical adenopathy.  Neurological: She is alert and oriented to person, place, and time. She has normal strength. No cranial nerve deficit or sensory deficit.  No acute motor or sensory deficits appreciated.  Skin: Skin is warm and dry.  Psychiatric: She has a normal mood and affect. Her speech is normal.  Nursing note and vitals reviewed.    ED Treatments / Results  Labs (all labs ordered are listed, but only abnormal results are displayed) Labs Reviewed - No data to display  EKG  EKG Interpretation None       Radiology Dg Lumbar Spine Complete  Result Date: 05/16/2017 CLINICAL DATA:  Fall on tailbone EXAM: LUMBAR SPINE - COMPLETE 4+ VIEW COMPARISON:  Chest  radiograph 06/03/2015 FINDINGS: There is mild levoscoliosis of the lumbar spine. There is compression deformity of L1, unchanged compared to 06/03/2015 chest radiograph. There is multilevel mild degenerative vertebral body height loss and disc space narrowing. Disc space loss is greatest at L5-S1. There is moderate to severe multilevel lower lumbar facet hypertrophy. IMPRESSION: 1. Moderate to severe lower lumbar facet arthrosis, which may be a source of localized low back pain. 2. No acute fracture or static subluxation of the lumbar spine. Electronically Signed   By: Deatra RobinsonKevin  Herman M.D.   On: 05/16/2017 16:38   Dg Sacrum/coccyx  Result Date: 05/16/2017 CLINICAL DATA:  Fall EXAM: SACRUM AND COCCYX - 2+ VIEW  COMPARISON:  None. FINDINGS: Anterior angulation of the distal coccyx is within acceptable limits for variation. Additionally, the patient report a history of remote coccyx fracture. No evidence of acute fracture. IMPRESSION: 1. No acute fracture of the coccyx. 2. Anterior angulation of the distal coccyx, likely secondary to remote trauma or congenital variation. Electronically Signed   By: Deatra Robinson M.D.   On: 05/16/2017 16:40    Procedures Procedures (including critical care time)  Medications Ordered in ED Medications  methocarbamol (ROBAXIN) tablet 500 mg (500 mg Oral Given 05/16/17 1632)  HYDROcodone-acetaminophen (NORCO/VICODIN) 5-325 MG per tablet 2 tablet (2 tablets Oral Given 05/16/17 1632)  ondansetron (ZOFRAN) tablet 4 mg (4 mg Oral Given 05/16/17 1632)     Initial Impression / Assessment and Plan / ED Course  I have reviewed the triage vital signs and the nursing notes.  Pertinent labs & imaging results that were available during my care of the patient were reviewed by me and considered in my medical decision making (see chart for details).       Final Clinical Impressions(s) / ED Diagnoses MDM Blood pressure is slightly elevated. Otherwise the vital signs within normal  limits. The pulse oximetry is 98% on room air. Within normal limits by my interpretation.  Patient complains of pain with even minimal movement in the bed. Patient was treated with 500 mg of Robaxin and oral hydrocodone and Zofran.  Patient reports improvement in pain and discomfort of her lower back.  X-ray of the lumbar spine reveals an old compression fracture at L1. There is disc space loss at the L5-S1 area consistent with moderate to severe lower lumbar facet arthrosis. There is no acute fracture or subluxation.  X-ray of the coccyx and sacral area showed no acute fracture of the coccyx or sacrum. There is some angulation of the distal coccyx it is believed to be related to old trauma, or congenital variation.  I discussed the findings of my examination as well as the examination by x-ray with the patient and her son in terms which they understand. I've asked him to discuss the symptoms as well as the today's workup with Dr. Selena Batten. They will decide if a possible MRI might be needed as an outpatient. In the interim I have asked the patient to cautiously use Robaxin 2 times daily, and mobic 2 times daily. The patient will take the Mobic with food. Patient is also given 10 tablets of hydrocodone to use for more severe pain. Patient strongly advised to schedule appointment with Dr. Selena Batten for pain management and for further evaluation moving forward.    Final diagnoses:  Contusion of lower back, initial encounter  DDD (degenerative disc disease), lumbar  Facet hypertrophy of lumbosacral region    New Prescriptions New Prescriptions   HYDROCODONE-ACETAMINOPHEN (NORCO/VICODIN) 5-325 MG TABLET    Take 1 tablet by mouth every 6 (six) hours as needed.   MELOXICAM (MOBIC) 7.5 MG TABLET    Take 1 tablet (7.5 mg total) by mouth 2 (two) times daily. Take with a meal   METHOCARBAMOL (ROBAXIN) 500 MG TABLET    Take 1 tablet (500 mg total) by mouth 2 (two) times daily.     Ivery Quale, PA-C 05/16/17  1740    Pricilla Loveless, MD 05/19/17 [phone]

## 2017-05-21 ENCOUNTER — Other Ambulatory Visit: Payer: Self-pay | Admitting: Family Medicine

## 2017-05-21 ENCOUNTER — Other Ambulatory Visit (HOSPITAL_COMMUNITY): Payer: Self-pay | Admitting: Family Medicine

## 2017-05-21 DIAGNOSIS — M545 Low back pain: Secondary | ICD-10-CM

## 2017-05-28 ENCOUNTER — Ambulatory Visit (HOSPITAL_COMMUNITY)
Admission: RE | Admit: 2017-05-28 | Discharge: 2017-05-28 | Disposition: A | Discharge status: Home / Self Care | Payer: Medicare Other | Source: Ambulatory Visit | Attending: Family Medicine | Admitting: Family Medicine

## 2017-05-28 DIAGNOSIS — M48061 Spinal stenosis, lumbar region without neurogenic claudication: Secondary | ICD-10-CM | POA: Diagnosis not present

## 2017-05-28 DIAGNOSIS — M4856XD Collapsed vertebra, not elsewhere classified, lumbar region, subsequent encounter for fracture with routine healing: Secondary | ICD-10-CM | POA: Insufficient documentation

## 2017-05-28 DIAGNOSIS — M5136 Other intervertebral disc degeneration, lumbar region: Secondary | ICD-10-CM | POA: Insufficient documentation

## 2017-05-28 DIAGNOSIS — S300XXD Contusion of lower back and pelvis, subsequent encounter: Secondary | ICD-10-CM | POA: Diagnosis present

## 2017-05-28 DIAGNOSIS — M545 Low back pain: Secondary | ICD-10-CM

## 2017-06-11 ENCOUNTER — Ambulatory Visit (HOSPITAL_COMMUNITY): Payer: Medicare Other

## 2017-06-11 ENCOUNTER — Ambulatory Visit (HOSPITAL_COMMUNITY)
Admission: RE | Admit: 2017-06-11 | Discharge: 2017-06-11 | Disposition: A | Discharge status: Home / Self Care | Payer: Medicare Other | Source: Ambulatory Visit | Attending: Internal Medicine | Admitting: Internal Medicine

## 2017-06-11 DIAGNOSIS — Z1231 Encounter for screening mammogram for malignant neoplasm of breast: Secondary | ICD-10-CM

## 2017-11-19 ENCOUNTER — Ambulatory Visit (HOSPITAL_COMMUNITY)
Admission: RE | Admit: 2017-11-19 | Discharge: 2017-11-19 | Disposition: A | Discharge status: Home / Self Care | Payer: Medicare Other | Source: Ambulatory Visit | Attending: Internal Medicine | Admitting: Internal Medicine

## 2017-11-19 ENCOUNTER — Other Ambulatory Visit (HOSPITAL_COMMUNITY): Payer: Self-pay | Admitting: Internal Medicine

## 2017-11-19 DIAGNOSIS — R0781 Pleurodynia: Secondary | ICD-10-CM

## 2017-11-19 DIAGNOSIS — R918 Other nonspecific abnormal finding of lung field: Secondary | ICD-10-CM | POA: Insufficient documentation

## 2018-03-11 ENCOUNTER — Other Ambulatory Visit (HOSPITAL_COMMUNITY): Payer: Self-pay | Admitting: Internal Medicine

## 2018-03-11 DIAGNOSIS — Z780 Asymptomatic menopausal state: Secondary | ICD-10-CM

## 2018-03-11 DIAGNOSIS — E559 Vitamin D deficiency, unspecified: Secondary | ICD-10-CM | POA: Diagnosis not present

## 2018-03-11 DIAGNOSIS — E785 Hyperlipidemia, unspecified: Secondary | ICD-10-CM | POA: Diagnosis not present

## 2018-03-11 DIAGNOSIS — I10 Essential (primary) hypertension: Secondary | ICD-10-CM | POA: Diagnosis not present

## 2018-03-22 ENCOUNTER — Other Ambulatory Visit (HOSPITAL_COMMUNITY): Payer: Medicare Other

## 2018-03-22 ENCOUNTER — Encounter (HOSPITAL_COMMUNITY): Payer: Self-pay

## 2018-04-12 IMAGING — MR MR LUMBAR SPINE W/O CM
4 of 5 series · 15 of 48 positions shown · non-contrast
Comparison: Radiography 05/16/2017

CLINICAL DATA: Low back pain and weakness following a fall 2 weeks
ago. Subsequent encounter

EXAM:
MRI LUMBAR SPINE WITHOUT CONTRAST
TECHNIQUE: Multiplanar, multisequence MR imaging of the lumbar spine was
performed. No intravenous contrast was administered.

[Series 3: T2 · sagittal · 4.0mm · 0.68mm/px · 6 of 15 slices shown (1 of 2)]
[im 1/15]
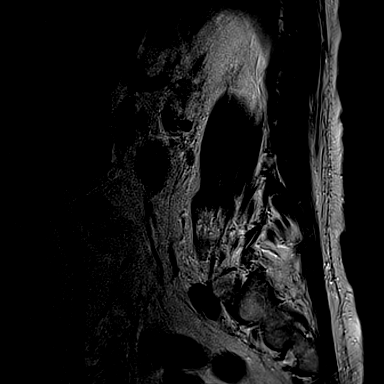
[im 3/15]
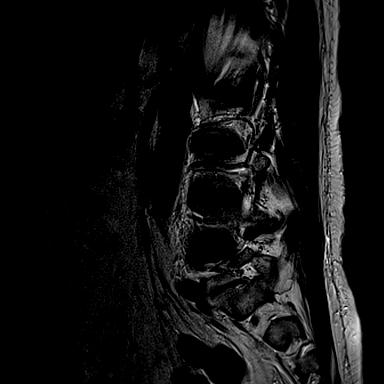
[im 6/15]
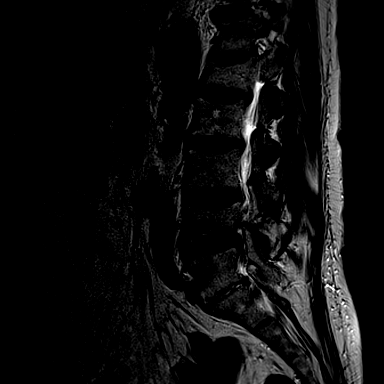
[im 9/15]
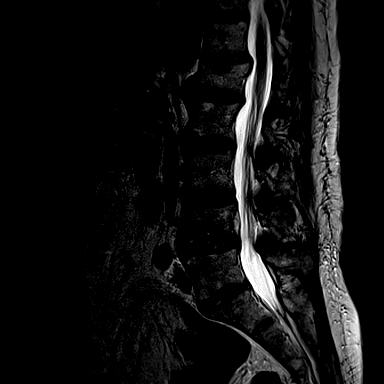
[im 12/15]
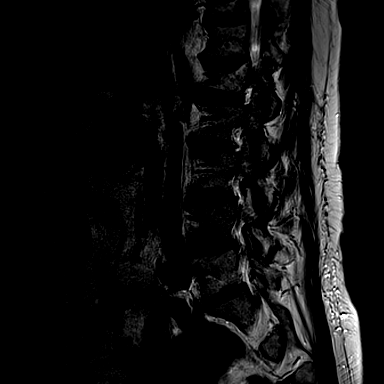
[im 15/15]
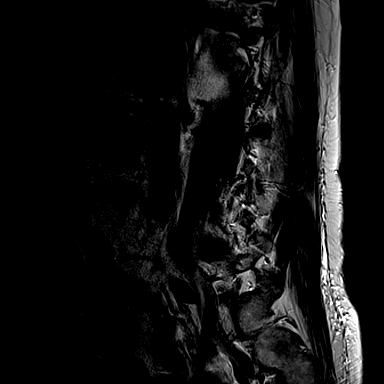

[Series 4: T1 · sagittal · 4.0mm · 0.34mm/px · 3 of 15 slices shown (1 of 2)]
[im 3/15]
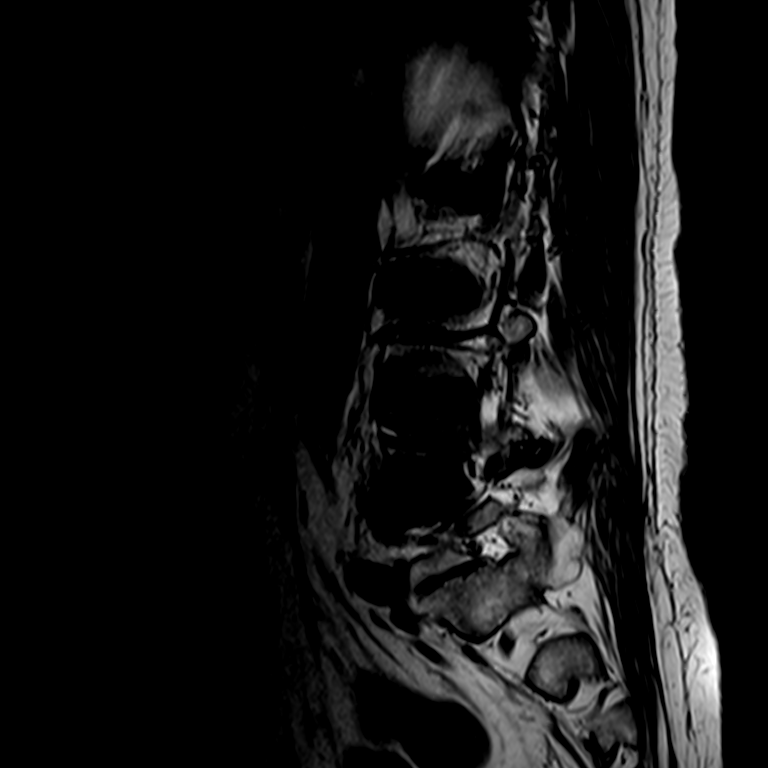
[im 9/15]
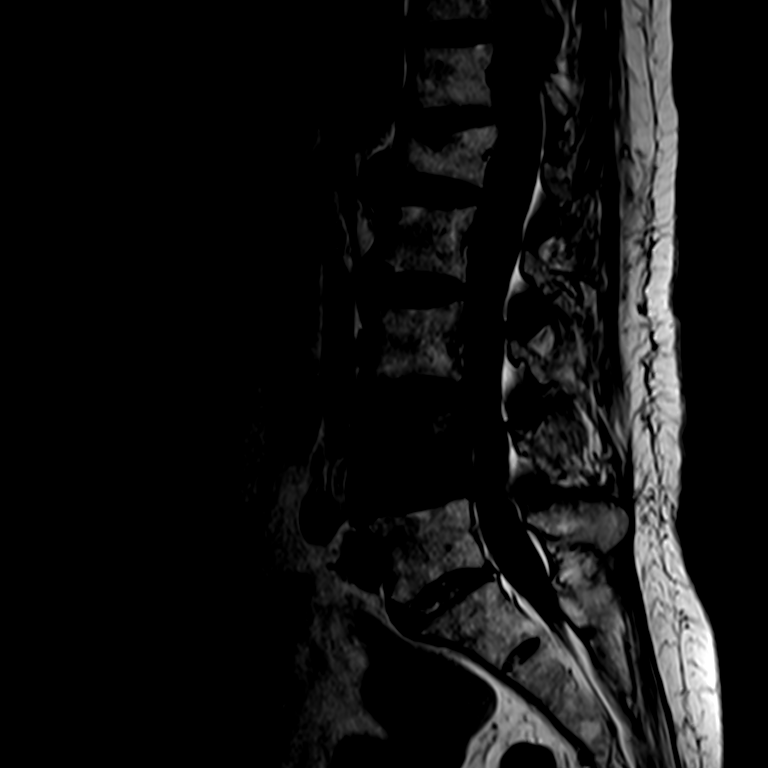
[im 15/15]
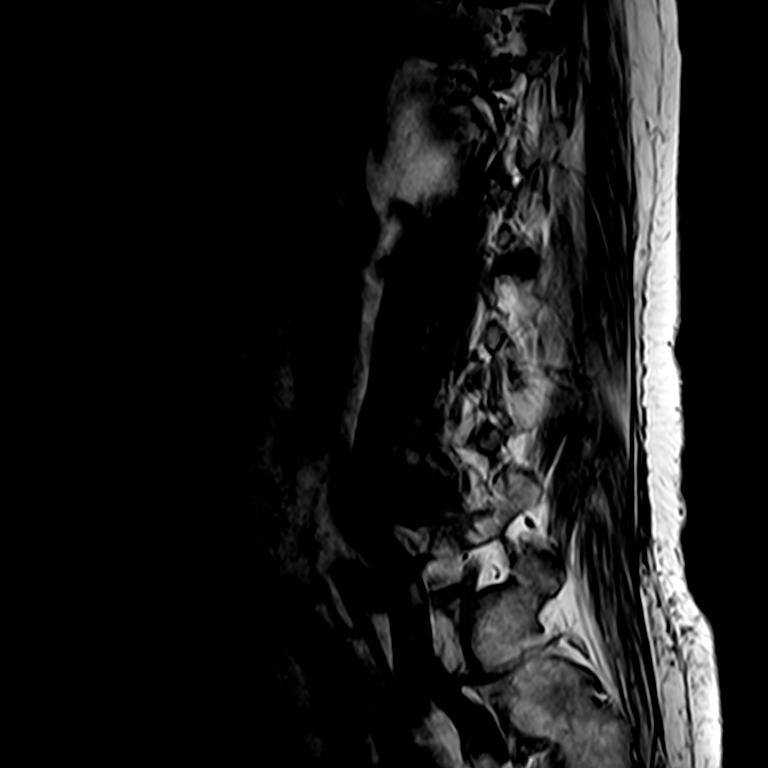

[Series 6: T2 · axial · 4.0mm · 0.25mm/px · z∈[-134,+15]mm · 3 of 40 slices shown (2 of 2)]
[im 6/40]
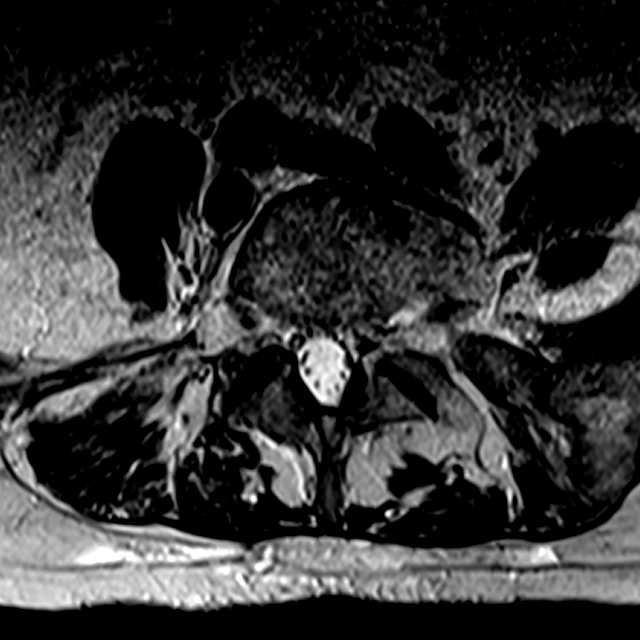
[im 20/40]
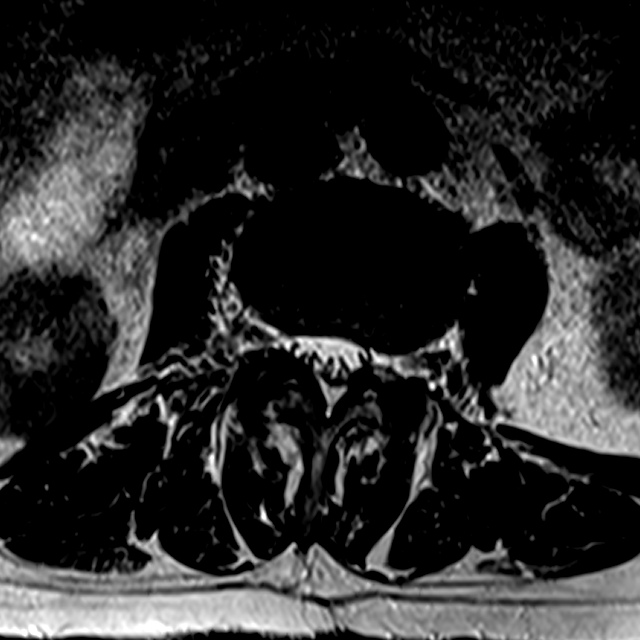
[im 34/40]
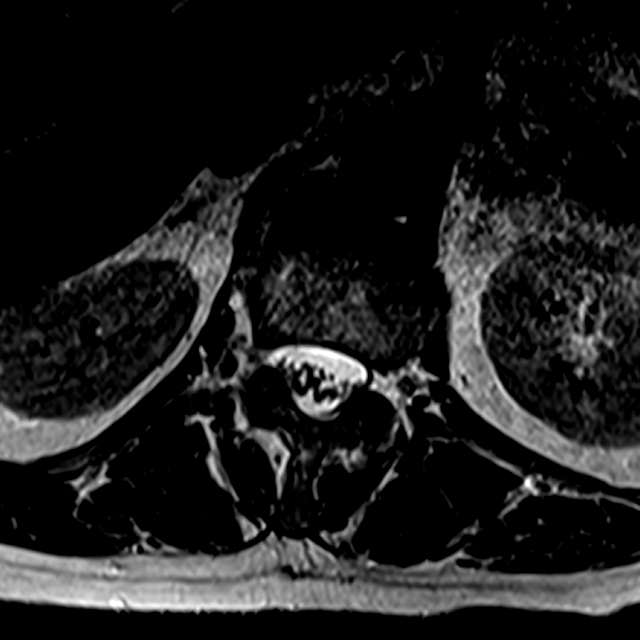

[Series 7: T1 · axial · 4.0mm · 0.25mm/px · z∈[-134,+15]mm · 3 of 40 slices shown (2 of 2)]
[im 6/40]
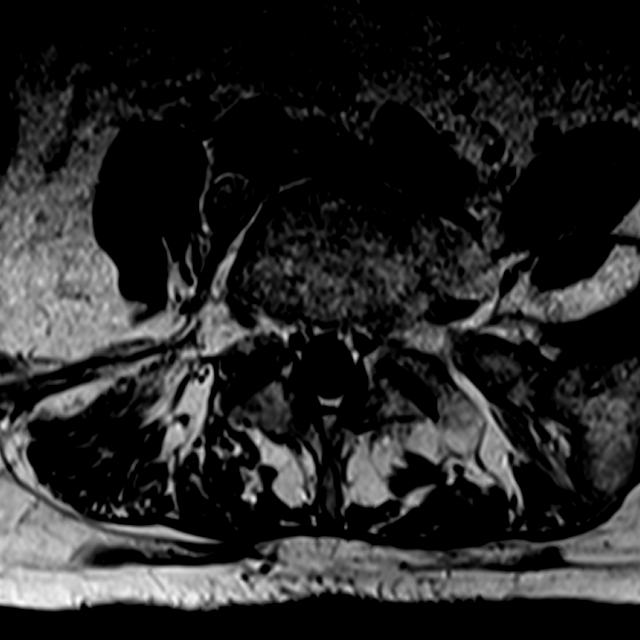
[im 20/40]
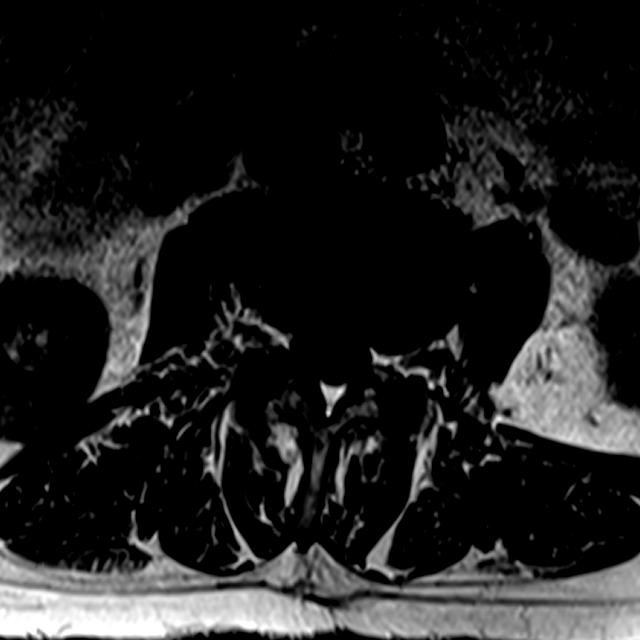
[im 34/40]
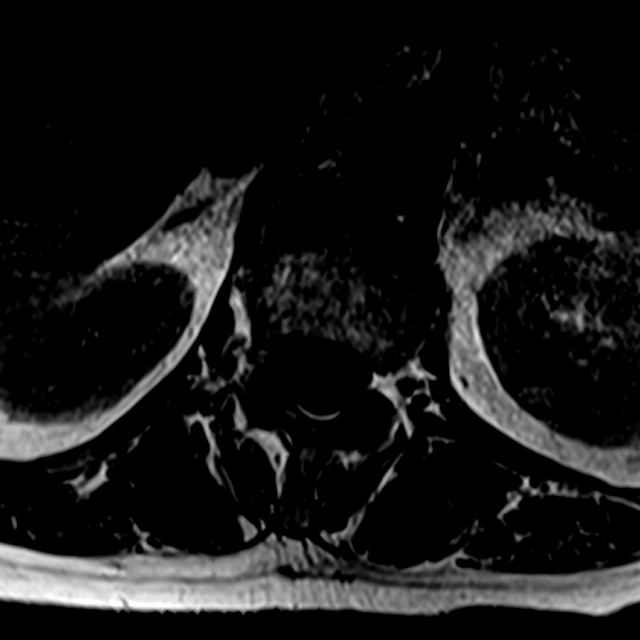

[15 of 48 positions shown; findings below may reference images not displayed]

FINDINGS: Segmentation:  Standard.

Alignment:  Physiologic.

Vertebrae: There is marrow edematous signal throughout the majority
of the L4 vertebral body, sparing the superior endplate. There is a
band of hypointense signal horizontally across the lower L4 body,
attributed to trabecular impaction. Mild paravertebral edema.
Findings are consistent with history of posttraumatic back pain for
2 weeks. No retropulsion. Height loss is less than 25%. No
definitive signs of underlying lesion. No evidence of bone lesion
elsewhere.

Remote L1 compression fracture with moderate height loss. No
retropulsion.

Conus medullaris: Extends to the L1-2 level and appears normal.

Paraspinal and other soft tissues: Traumatic edema as noted above

Disc levels:

T12- L1: Posttraumatic disc distortion without herniation or
impingement

L1-L2: Mild disc narrowing, bulging, and desiccation. Negative
facets. No impingement

L2-L3: Asymmetric right degenerative facet spurring. Disc bulging
with left foraminal predominance. No impingement

L3-L4: Left eccentric disc bulging. Mild facet spurring. Mild AP
thecal sac narrowing without compressive stenosis.

L4-L5: Disc bulging greater to the left. Facet arthropathy with
asymmetric left-sided facet spurring. Moderate spinal stenosis and
left more than right subarticular recess narrowing. Moderate left
foraminal narrowing.

L5-S1:Bulky left far-lateral endplate spurring causing ankylosis.
Negative facets. No compressive stenosis .
IMPRESSION: 1. Acute/subacute L4 compression fracture with mild height loss and
no retropulsion.
2. Remote L1 compression fracture.
3. Disc and facet degeneration greatest at L4-5 where there is
moderate spinal and left foraminal stenosis.

## 2018-05-20 ENCOUNTER — Other Ambulatory Visit (HOSPITAL_COMMUNITY): Payer: Self-pay | Admitting: Internal Medicine

## 2018-05-20 DIAGNOSIS — Z1231 Encounter for screening mammogram for malignant neoplasm of breast: Secondary | ICD-10-CM

## 2018-05-21 DIAGNOSIS — L814 Other melanin hyperpigmentation: Secondary | ICD-10-CM | POA: Diagnosis not present

## 2018-06-13 ENCOUNTER — Ambulatory Visit (HOSPITAL_COMMUNITY)
Admission: RE | Admit: 2018-06-13 | Discharge: 2018-06-13 | Disposition: A | Discharge status: Home / Self Care | Payer: Medicare Other | Source: Ambulatory Visit | Attending: Internal Medicine | Admitting: Internal Medicine

## 2018-06-13 DIAGNOSIS — Z1231 Encounter for screening mammogram for malignant neoplasm of breast: Secondary | ICD-10-CM | POA: Insufficient documentation

## 2018-07-23 DIAGNOSIS — L814 Other melanin hyperpigmentation: Secondary | ICD-10-CM | POA: Diagnosis not present

## 2018-07-23 DIAGNOSIS — E785 Hyperlipidemia, unspecified: Secondary | ICD-10-CM | POA: Diagnosis not present

## 2018-07-23 DIAGNOSIS — I10 Essential (primary) hypertension: Secondary | ICD-10-CM | POA: Diagnosis not present

## 2018-07-23 DIAGNOSIS — E559 Vitamin D deficiency, unspecified: Secondary | ICD-10-CM | POA: Diagnosis not present

## 2019-07-29 ENCOUNTER — Ambulatory Visit (INDEPENDENT_AMBULATORY_CARE_PROVIDER_SITE_OTHER): Payer: Medicare Other | Admitting: Family Medicine

## 2019-07-29 ENCOUNTER — Encounter: Payer: Self-pay | Admitting: Family Medicine

## 2019-07-29 ENCOUNTER — Other Ambulatory Visit: Payer: Self-pay

## 2019-07-29 VITALS — BP 123/80 | HR 86 | Temp 98.5°F | Ht 65.0 in | Wt 179.0 lb

## 2019-07-29 DIAGNOSIS — E785 Hyperlipidemia, unspecified: Secondary | ICD-10-CM | POA: Diagnosis not present

## 2019-07-29 DIAGNOSIS — I10 Essential (primary) hypertension: Secondary | ICD-10-CM

## 2019-07-29 DIAGNOSIS — Z720 Tobacco use: Secondary | ICD-10-CM | POA: Diagnosis not present

## 2019-07-29 DIAGNOSIS — M8580 Other specified disorders of bone density and structure, unspecified site: Secondary | ICD-10-CM

## 2019-07-29 DIAGNOSIS — F419 Anxiety disorder, unspecified: Secondary | ICD-10-CM

## 2019-07-29 NOTE — Progress Notes (Signed)
New Patient Office Visit  Subjective:  Patient ID: Valerie Marshall, female    DOB: [birth year]  Age: 78 y.o. MRN: [id number]  CC:  Chief Complaint  Patient presents with  . New Patient (Initial Visit)  HTN  HPI Valerie Marshall presents for HTN-Hyperlipidemia-Anxiety-Osteopenia Tob abuse Past Medical History:  Diagnosis Date  . Anxiety   . Hemorrhoids   . Hernia of abdominal cavity   . Hyperlipidemia   . Hypertension   . Irritable bowel syndrome   . Osteopenia     Past Surgical History:  Procedure Laterality Date  . EYE SURGERY Bilateral    lasix   . NECK SURGERY      Family History  Problem Relation Age of Onset  . Breast cancer Sister   . Diabetes Sister   . Colon cancer Neg Hx   . Colon polyps Neg Hx   . Kidney disease Neg Hx   . Esophageal cancer Neg Hx   . Gallbladder disease Neg Hx     Social History  Drove dump truck [employer] Socioeconomic History  . Marital status: Single    Spouse name: Not on file  . Number of children: 1  . Years of education: Not on file  . Highest education level: Not on file  Occupational History  . Occupation: Retired  Engineer, production  . Financial resource strain: Not on file  . Food insecurity    Worry: Not on file    Inability: Not on file  . Transportation needs    Medical: Not on file    Non-medical: Not on file  Tobacco Use  . Smoking status: Current Every Day Smoker    Packs/day: 0.50    Years: 45.00    Pack years: 22.50    Types: Cigarettes  . Smokeless tobacco: Never Used  Substance and Sexual Activity  . Alcohol use: No    Alcohol/week: 0.0 standard drinks  . Drug use: No  . Sexual activity: Not on file  Lifestyle  . Physical activity    Days per week: Not on file    Minutes per session: Not on file  . Stress: Not on file  Relationships  . Social Musician on phone: Not on file    Gets together: Not on file    Attends religious service: Not on file    Active  member of club or organization: Not on file    Attends meetings of clubs or organizations: Not on file    Relationship status: Not on file  . Intimate partner violence    Fear of current or ex partner: Not on file    Emotionally abused: Not on file    Physically abused: Not on file    Forced sexual activity: Not on file  Other Topics Concern  . Not on file  Social History Narrative  . Not on file    ROS Review of Systems  Constitutional: Negative for fever and unexpected weight change.       Lasic-no glasses needed-4 years  HENT: Negative for hearing loss and sinus pressure.   Eyes: Negative for visual disturbance.  Respiratory: Negative.  Negative for cough and shortness of breath.   Cardiovascular: Negative.  Negative for chest pain, palpitations and leg swelling.  Gastrointestinal: Negative for abdominal pain.       Colonoscopy-[place]  Genitourinary:       P1G1  Musculoskeletal: Positive for neck pain.       Neck  surgery-cervical surgery DDD-lower back   Neurological: Negative for headaches.  Psychiatric/Behavioral: The patient is nervous/anxious.     Objective:   Today's Vitals: BP 123/80 (BP Location: Left Arm, Patient Position: Sitting, Cuff Size: Normal)   Pulse 86   Temp 98.5 F (36.9 C) (Oral)   Ht 5\' 5"  (1.651 m)   Wt 179 lb (81.2 kg)   SpO2 92%   BMI 29.79 kg/m   Physical Exam Constitutional:      Appearance: Normal appearance. She is normal weight.  HENT:     Head: Normocephalic and atraumatic.     Right Ear: Tympanic membrane, ear canal and external ear normal.     Left Ear: Tympanic membrane, ear canal and external ear normal.     Nose: Nose normal.     Mouth/Throat:     Mouth: Mucous membranes are moist.  Eyes:     Conjunctiva/sclera: Conjunctivae normal.  Neck:     Musculoskeletal: Normal range of motion and neck supple.  Cardiovascular:     Rate and Rhythm: Normal rate and regular rhythm.     Pulses: Normal pulses.     Heart sounds:  Normal heart sounds.  Pulmonary:     Effort: Pulmonary effort is normal.     Breath sounds: Normal breath sounds.  Musculoskeletal:     Right lower leg: Edema present.     Left lower leg: Edema present.     Comments: trace  Neurological:     Mental Status: She is alert and oriented to person, place, and time.  Psychiatric:        Mood and Affect: Mood normal.        Behavior: Behavior normal.     Assessment & Plan:  1. Essential hypertension Amlodipine and lisinopril Pt states she uses K -no currently on med list and pt does not know dosage labwork 2 weeks ago 2. Tobacco abuse Pt has no desire to quit Pt does not know if she has had a CT scan for lung CA-cxr 2018 impression-question COPD with stable bibasilar interstitial prominence  3. Osteopenia, unspecified location DEXA 2017-recommended scan-pt states she does not need a referral  4. Hyperlipidemia, unspecified hyperlipidemia type No meds currently-labwork 2 weeks ago Dorette GrateLisa Luisangel Wainright, MD 5. Anxiety TakesTranxene-d/w pt  WILL NOT BE ABLE TO WRITE RX FOR THIS MEDICATION. Offered pt a referral to psy for follow up-pt declined and stated "I do not need a psychiatrist "  D/w pt if long term use of medication abruptly going off medication could be difficult. Pt did not acknowledge a solution for finding another provider or agreeing to a referral.  Fill Date ID   Written Drug Qty Days Prescriber Rx # Pharmacy Refill   Daily Dose* Pymt Type PMP    07/16/2019  2   07/16/2019  Clorazepate 7.5 MG Tablet  30.00 30 [place]   [id number]   Car (9744)   0  0.50 LME  Medicare   [place]  06/19/2019  2   05/20/2019  Clorazepate 7.5 MG Tablet  30.00 30 [place]   [id number]   Car (9744)   1  0.50 LME  Medicare   [place]  05/20/2019  2   05/20/2019  Clorazepate 7.5 MG Tablet  30.00 30 [place]   [id number]   Car (9744)   0  0.50 LME  Medicare   [place]  04/22/2019  2   01/13/2019  Clorazepate 7.5 MG Tablet  30.00 30 [place]   [id number]  Car (9744)   2  0.50 LME  Medicare   [place]   03/21/2019  2   01/13/2019  Clorazepate 7.5 MG Tablet  30.00 30 [place]   [id number]   Car (9744)   1  0.50 LME  Medicare   [place]  02/13/2019  2   01/13/2019  Clorazepate 7.5 MG Tablet  30.00 30 [place]   [id number]   Car (9744)   0  0.50 LME  Medicare   [place]  01/13/2019  2   12/09/2018  Clorazepate 7.5 MG Tablet  30.00 30 [place]   [id number]   Car (9744)   1  0.50 LME  Medicare   [place]  12/09/2018  2   12/09/2018  Clorazepate 7.5 MG Tablet  30.00 30 [place]   [id number]   Car (9744)   0  0.50 LME  Medicare   [place]  11/07/2018  2   09/12/2018  Clorazepate 7.5 MG Tablet  30.00 30 [place]   [id number]   Car (9744)   1  0.50 LME  Medicare   [place]  10/09/2018  2   09/12/2018  Clorazepate 7.5 MG Tablet  30.00 30 [place]   [id number]   Car (9744)   0  0.50 LME  Medicare   [place]  09/12/2018  2   07/18/2018  Clorazepate 7.5 MG Tablet  30.00 30 [place]   [id number]   Car (9744)   1  0.50 LME  Medicare     08/15/2018  2   07/18/2018  Clorazepate 7.5 MG Tablet  30.00 30 [place]   [id number]   Car (9744)   0  0.50 LME       Pt used Norco in the past for back pain. Pt states she has not taken the medication in 2 years. D/w pt WILL NOT BE ABLE TO WRITE RX FOR THIS MEDICATION Outpatient Encounter Medications as of 07/29/2019  Medication Sig  . acetaminophen (TYLENOL) 500 MG tablet Take 500 mg by mouth every 6 (six) hours as needed. For eye pain  . amLODipine (NORVASC) 2.5 MG tablet Take 2.5 mg by mouth daily.  . clorazepate (TRANXENE) 7.5 MG tablet Take 7.5 mg by mouth daily.  . fenofibrate (TRICOR) 145 MG tablet   . furosemide (LASIX) 40 MG tablet Take 40 mg by mouth as needed.   . hydrochlorothiazide (HYDRODIURIL) 25 MG tablet Take 25 mg by mouth daily.  Marland Kitchen HYDROcodone-acetaminophen (NORCO/VICODIN) 5-325 MG tablet Take 1 tablet by mouth every 6 (six) hours as needed.  Marland Kitchen lisinopril (PRINIVIL,ZESTRIL) 20 MG tablet Take 20 mg by mouth daily.  Marland Kitchen lisinopril (PRINIVIL,ZESTRIL) 5 MG tablet Take 5 mg by mouth daily.  . [DISCONTINUED] meloxicam (MOBIC)  7.5 MG tablet Take 1 tablet (7.5 mg total) by mouth 2 (two) times daily. Take with a meal (Patient not taking: Reported on 07/29/2019)  . [DISCONTINUED] methocarbamol (ROBAXIN) 500 MG tablet Take 1 tablet (500 mg total) by mouth 2 (two) times daily. (Patient not taking: Reported on 07/29/2019)  . [DISCONTINUED] rosuvastatin (CRESTOR) 20 MG tablet Take 20 mg by mouth daily.   No facility-administered encounter medications on file as of 07/29/2019.    Carmaleta Youngers Hannah Beat, MD

## 2019-07-29 NOTE — Patient Instructions (Addendum)
Pt needs to consider concern for withdrawal from Tranxene since long term medication-last fill date 07/16/19-will not be able to give refill for this medication  DEXA and mammogram recommended  Consider taking Calcium 500mg  twice a day + Vit D-for osteopenia  Consider quitting tob use

## 2019-09-29 ENCOUNTER — Other Ambulatory Visit (INDEPENDENT_AMBULATORY_CARE_PROVIDER_SITE_OTHER): Payer: Self-pay | Admitting: Internal Medicine

## 2019-10-16 ENCOUNTER — Ambulatory Visit (INDEPENDENT_AMBULATORY_CARE_PROVIDER_SITE_OTHER): Payer: Medicare Other | Admitting: Internal Medicine

## 2019-10-16 ENCOUNTER — Encounter (INDEPENDENT_AMBULATORY_CARE_PROVIDER_SITE_OTHER): Payer: Self-pay | Admitting: Internal Medicine

## 2019-10-16 ENCOUNTER — Other Ambulatory Visit: Payer: Self-pay

## 2019-10-16 VITALS — BP 124/88 | HR 101 | Temp 97.4°F | Resp 18 | Ht 63.0 in | Wt 184.2 lb

## 2019-10-16 DIAGNOSIS — F419 Anxiety disorder, unspecified: Secondary | ICD-10-CM | POA: Diagnosis not present

## 2019-10-16 DIAGNOSIS — I10 Essential (primary) hypertension: Secondary | ICD-10-CM

## 2019-10-16 DIAGNOSIS — E669 Obesity, unspecified: Secondary | ICD-10-CM

## 2019-10-16 DIAGNOSIS — E559 Vitamin D deficiency, unspecified: Secondary | ICD-10-CM

## 2019-10-16 DIAGNOSIS — E782 Mixed hyperlipidemia: Secondary | ICD-10-CM

## 2019-10-16 HISTORY — DX: Vitamin D deficiency, unspecified: E55.9

## 2019-10-16 NOTE — Patient Instructions (Signed)
[facility] Dietary Recommendations for Weight Loss What to Avoid . Avoid added sugars o Often added sugar can be found in processed foods such as many condiments, dry cereals, cakes, cookies, chips, crisps, crackers, candies, sweetened drinks, etc.  o Read labels and AVOID/DECREASE use of foods with the following in their ingredient list: Sugar, fructose, high fructose corn syrup, sucrose, glucose, maltose, dextrose, molasses, cane sugar, brown sugar, any type of syrup, agave nectar, etc.   . Avoid snacking in between meals . Avoid foods made with flour o If you are going to eat food made with flour, choose those made with whole-grains; and, minimize your consumption as much as is tolerable . Avoid processed foods o These foods are generally stocked in the middle of the grocery store. Focus on shopping on the perimeter of the grocery.  What to Include . Vegetables o GREEN LEAFY VEGETABLES: Kale, spinach, mustard greens, collard greens, cabbage, broccoli, etc. o OTHER: Asparagus, cauliflower, eggplant, carrots, peas, Brussel sprouts, tomatoes, bell peppers, zucchini, beets, cucumbers, etc. . Grains, seeds, and legumes o Beans: kidney beans, black eyed peas, garbanzo beans, black beans, pinto beans, etc. o Whole, unrefined grains: brown rice, barley, bulgur, oatmeal, etc. . Healthy fats  o Avoid highly processed fats such as vegetable oil o Examples of healthy fats: avocado, olives, virgin olive oil, dark chocolate (?72% Cocoa), nuts (peanuts, almonds, walnuts, cashews, pecans, etc.) . Low - Moderate Intake of Animal Sources of Protein o Meat sources: chicken, turkey, salmon, tuna. Limit to 4 ounces of meat at one time. o Consider limiting dairy sources, but when choosing dairy focus on: PLAIN Greek yogurt, cottage cheese, high-protein milk . Fruit o Choose berries  When to Eat . Intermittent Fasting: o Choosing not to eat for a specific time period, but DO FOCUS ON HYDRATION  when fasting o Multiple Techniques: - Time Restricted Eating: eat 3 meals in a day, each meal lasting no more than 60 minutes, no snacks between meals - 16-18 hour fast: fast for 16 to 18 hours up to 7 days a week. Often suggested to start with 2-3 nonconsecutive days per week.  . Remember the time you sleep is counted as fasting.  . Examples of eating schedule: Fast from 7:00pm-11:00am. Eat between 11:00am-7:00pm.  - 24-hour fast: fast for 24 hours up to every other day. Often suggested to start with 1 day per week . Remember the time you sleep is counted as fasting . Examples of eating schedule:  o Eating day: eat 2-3 meals on your eating day. If doing 2 meals, each meal should last no more than 90 minutes. If doing 3 meals, each meal should last no more than 60 minutes. Finish last meal by 7:00pm. o Fasting day: Fast until 7:00pm.  o IF YOU FEEL UNWELL FOR ANY REASON/IN ANY WAY WHEN FASTING, STOP FASTING BY EATING A NUTRITIOUS SNACK OR LIGHT MEAL o ALWAYS FOCUS ON HYDRATION DURING FASTS - Acceptable Hydration sources: water, broths, tea/coffee (black tea/coffee is best but using a small amount of whole-fat dairy products in coffee/tea is acceptable).  - Poor Hydration Sources: anything with sugar or artificial sweeteners added to it  These recommendations have been developed for patients that are actively receiving medical care from either Dr. Innocence Schlotzhauer or Sarah Gray, DNP, NP-C at [facility]. These recommendations are developed for patients with specific medical conditions and are not meant to be distributed or used by others that are not actively receiving care from either provider listed   above at [facility]. It is not appropriate to participate in the above eating plans without proper medical supervision.   Reference: Fung, J. The obesity code. [place]: [place]; 2016.   

## 2019-10-16 NOTE — Progress Notes (Signed)
Metrics: Intervention Frequency ACO  Documented Smoking Status Yearly  Screened one or more times in 24 months  Cessation Counseling or  Active cessation medication Past 24 months  Past 24 months   Guideline developer: UpToDate (See UpToDate for funding source) Date Released: 2014       Wellness Office Visit  Subjective:  Patient ID: [facility], female    DOB: 1941/04/24  Age: 78 y.o. MRN: [id number]  CC: This lady comes in for follow-up of hypertension, anxiety, vitamin D deficiency, osteopenia and obesity. HPI  She is doing reasonably well but because of the COVID-19 pandemic, she has become more stressed, sits around in the house and snacks all day long, admitting that she eats junk food.  As a result, she has gained significant amount of weight. She is compliant with antihypertensive therapy.  She denies any chest pain, dyspnea, palpitations or limb weakness. She continues with vitamin D3 supplementation for vitamin D deficiency. She continues with Tranxene for her anxiety and she does not abuse this. Past Medical History:  Diagnosis Date  . Anxiety   . Hemorrhoids   . Hernia of abdominal cavity   . Hyperlipidemia   . Hypertension   . Irritable bowel syndrome   . Osteopenia   . Vitamin D deficiency disease 10/16/2019      Family History  Problem Relation Age of Onset  . Breast cancer Sister   . Diabetes Sister   . Colon cancer Neg Hx   . Colon polyps Neg Hx   . Kidney disease Neg Hx   . Esophageal cancer Neg Hx   . Gallbladder disease Neg Hx     Social History   Social History Narrative   Divorced.Lives alone with dog.Retired.Previously Furniture conservator/restorer.   Social History   Tobacco Use  . Smoking status: Current Every Day Smoker    Packs/day: 0.50    Years: 45.00    Pack years: 22.50    Types: Cigarettes  . Smokeless tobacco: Never Used  Substance Use Topics  . Alcohol use: No    Alcohol/week: 0.0 standard drinks    Current Meds  Medication Sig  .  amLODipine (NORVASC) 5 MG tablet TAKE 1 TABLET BY MOUTH ONCE A DAY.  Marland Kitchen Cholecalciferol (VITAMIN D-3) 125 MCG (5000 UT) TABS Take 1 tablet by mouth daily.  . clorazepate (TRANXENE) 7.5 MG tablet Take 7.5 mg by mouth daily.  Marland Kitchen lisinopril (PRINIVIL,ZESTRIL) 20 MG tablet Take 20 mg by mouth daily.  . potassium chloride SA (KLOR-CON) 20 MEQ tablet Take 20 mEq by mouth 2 (two) times daily.  . [DISCONTINUED] lisinopril (PRINIVIL,ZESTRIL) 5 MG tablet Take 5 mg by mouth daily.      Objective:   Today's Vitals: BP 124/88 (BP Location: Left Arm, Patient Position: Sitting, Cuff Size: Normal)   Pulse (!) 101   Temp (!) 97.4 F (36.3 C) (Temporal)   Resp 18   Ht '5\' 3"'  (1.6 m)   Wt 184 lb 3.2 oz (83.6 kg)   SpO2 90% Comment: with mask on.  BMI 32.63 kg/m  Vitals with BMI 10/16/2019 07/29/2019 05/16/2017  Height '5\' 3"'  '5\' 5"'  '5\' 6"'   Weight 184 lbs 3 oz 179 lbs 173 lbs  BMI 16.01 09.32 28  Systolic 355 732 202  Diastolic 88 80 91  Pulse 542 86 96     Physical Exam  She looks systemically well.  Her blood pressure is reasonable for her age.  She is alert and orientated without any focal neurological signs.  She is now obese because of the weight gain.     Assessment   1. Essential hypertension   2. Mixed hyperlipidemia   3. Anxiety   4. Vitamin D deficiency disease   5. Obesity (BMI 30.0-34.9)       Tests ordered Orders Placed This Encounter  Procedures  . CMP with eGFR([facility])  . Vitamin D, 25-hydroxy     Plan: 1. Blood work is ordered as above. 2. She will continue with antihypertensive therapy.  This seems to be controlling her blood pressures adequately. 3. She will continue with vitamin D3 supplementation and we will see what the levels show. 4. She will continue with Tranxene for anxiety which seems to be keeping her fairly stable. 5. I encouraged to try and lose weight and I have given her a diet sheet with the concept of intermittent fasting and if she can do 16 hours  every day, I think she will start to lose weight.  Together with this, I have given her a diet sheet regarding healthy food since and not to eat junk foods and snacking.  Also encouraged her to exercise by taking walks on a daily basis outside. 6. Further recommendations will depend on blood results and I will see her in about 3 months time for an annual physical exam.   No orders of the defined types were placed in this encounter.   Doree Albee, MD

## 2019-10-17 LAB — COMPLETE METABOLIC PANEL WITH GFR
AG Ratio: 1.1 (calc) (ref 1.0–2.5)
ALT: 11 U/L (ref 6–29)
AST: 20 U/L (ref 10–35)
Albumin: 3.9 g/dL (ref 3.6–5.1)
Alkaline phosphatase (APISO): 107 U/L (ref 37–153)
BUN/Creatinine Ratio: 15 (calc) (ref 6–22)
BUN: 15 mg/dL (ref 7–25)
CO2: 24 mmol/L (ref 20–32)
Calcium: 10 mg/dL (ref 8.6–10.4)
Chloride: 103 mmol/L (ref 98–110)
Creat: 1.02 mg/dL — ABNORMAL HIGH (ref 0.60–0.93)
GFR, Est African American: 61 mL/min/{1.73_m2} (ref >=60)
GFR, Est Non African American: 53 mL/min/{1.73_m2} — ABNORMAL LOW (ref >=60)
Globulin: 3.4 g/dL (calc) (ref 1.9–3.7)
Glucose, Bld: 92 mg/dL (ref 65–99)
Potassium: 4.1 mmol/L (ref 3.5–5.3)
Sodium: 139 mmol/L (ref 135–146)
Total Bilirubin: 1 mg/dL (ref 0.2–1.2)
Total Protein: 7.3 g/dL (ref 6.1–8.1)

## 2019-10-17 LAB — VITAMIN D 25 HYDROXY (VIT D DEFICIENCY, FRACTURES): Vit D, 25-Hydroxy: 47 ng/mL (ref 30–100)

## 2019-10-20 ENCOUNTER — Encounter (INDEPENDENT_AMBULATORY_CARE_PROVIDER_SITE_OTHER): Payer: Self-pay | Admitting: Internal Medicine

## 2019-10-30 ENCOUNTER — Other Ambulatory Visit (INDEPENDENT_AMBULATORY_CARE_PROVIDER_SITE_OTHER): Payer: Self-pay | Admitting: Internal Medicine

## 2019-11-25 ENCOUNTER — Other Ambulatory Visit (INDEPENDENT_AMBULATORY_CARE_PROVIDER_SITE_OTHER): Payer: Self-pay | Admitting: Internal Medicine

## 2019-12-30 ENCOUNTER — Other Ambulatory Visit (INDEPENDENT_AMBULATORY_CARE_PROVIDER_SITE_OTHER): Payer: Self-pay | Admitting: Internal Medicine

## 2020-01-22 ENCOUNTER — Other Ambulatory Visit: Payer: Self-pay

## 2020-01-22 ENCOUNTER — Ambulatory Visit (INDEPENDENT_AMBULATORY_CARE_PROVIDER_SITE_OTHER): Payer: Medicare Other | Admitting: Internal Medicine

## 2020-01-22 ENCOUNTER — Encounter (INDEPENDENT_AMBULATORY_CARE_PROVIDER_SITE_OTHER): Payer: Self-pay | Admitting: Internal Medicine

## 2020-01-22 DIAGNOSIS — F419 Anxiety disorder, unspecified: Secondary | ICD-10-CM

## 2020-01-22 DIAGNOSIS — I10 Essential (primary) hypertension: Secondary | ICD-10-CM

## 2020-01-22 DIAGNOSIS — E559 Vitamin D deficiency, unspecified: Secondary | ICD-10-CM | POA: Diagnosis not present

## 2020-01-22 MED ORDER — CLORAZEPATE DIPOTASSIUM 7.5 MG PO TABS: ORAL_TABLET | ORAL | 1 refills | Status: DC

## 2020-01-22 MED ORDER — LISINOPRIL 20 MG PO TABS: 20.0000 mg | ORAL_TABLET | Freq: Every day | ORAL | 0 refills | Status: DC

## 2020-01-22 NOTE — Progress Notes (Signed)
Metrics: Intervention Frequency ACO  Documented Smoking Status Yearly  Screened one or more times in 24 months  Cessation Counseling or  Active cessation medication Past 24 months  Past 24 months   Guideline developer: UpToDate (See UpToDate for funding source) Date Released: 2014       Wellness Office Visit  Subjective:  Patient ID: [facility], female    DOB: 08-Nov-1941  Age: 79 y.o. MRN: [id number]  CC: This is an audio telemedicine visit with the permission of the patient who is at home and I am in my  home also.  I was able to easily recognize her voice. This visit is to follow-up on her chronic conditions namely hypertension and anxiety.  HPI  She says that she is doing well.  She is compliant with her antihypertensive medication and she agrees to refill on one of them.  She also takes clorazepate for anxiety and she never abuses it.  She also needs a refill for this. She feels that she is gaining some weight but she is drinking some green tea which helps her fast in the morning time.  She is also taking vitamin D3 supplementation of vitamin D deficiency. She denies any chest pain, dyspnea, palpitations or limb weakness. Past Medical History:  Diagnosis Date  . Anxiety   . Hemorrhoids   . Hernia of abdominal cavity   . Hyperlipidemia   . Hypertension   . Irritable bowel syndrome   . Osteopenia   . Vitamin D deficiency disease 10/16/2019      Family History  Problem Relation Age of Onset  . Breast cancer Sister   . Diabetes Sister   . Colon cancer Neg Hx   . Colon polyps Neg Hx   . Kidney disease Neg Hx   . Esophageal cancer Neg Hx   . Gallbladder disease Neg Hx     Social History   Social History Narrative   Divorced.Lives alone with dog.Retired.Previously Furniture conservator/restorer.   Social History   Tobacco Use  . Smoking status: Current Every Day Smoker    Packs/day: 0.50    Years: 45.00    Pack years: 22.50    Types: Cigarettes  . Smokeless tobacco: Never  Used  Substance Use Topics  . Alcohol use: No    Alcohol/week: 0.0 standard drinks    Current Meds  Medication Sig  . amLODipine (NORVASC) 5 MG tablet TAKE 1 TABLET BY MOUTH ONCE A DAY.  Marland Kitchen Cholecalciferol (VITAMIN D-3) 125 MCG (5000 UT) TABS Take 1 tablet by mouth daily.  . clorazepate (TRANXENE) 7.5 MG tablet TAKE ONE TABLET DAILY AS NEEDED.  Marland Kitchen lisinopril (ZESTRIL) 20 MG tablet Take 1 tablet (20 mg total) by mouth daily.  . potassium chloride SA (KLOR-CON) 20 MEQ tablet Take 20 mEq by mouth 2 (two) times daily.      Objective:   Today's Vitals: There were no vitals taken for this visit. Vitals with BMI 10/16/2019 07/29/2019 05/16/2017  Height 5\' 3"  5\' 5"  5\' 6"   Weight 184 lbs 3 oz 179 lbs 173 lbs  BMI 40.98 11.91 28  Systolic 478 295 621  Diastolic 88 80 91  Pulse 308 86 96     Physical Exam  Her speech appeared normal on the phone and she appeared to be alert and orientated and was not dyspneic while she was talking.     Assessment   1. Essential hypertension   2. Anxiety   3. Vitamin D deficiency disease  Tests ordered No orders of the defined types were placed in this encounter.    Plan: 1. I have refilled her Lisinopril and Tranxene. 2. I will see her in about a month for follow up. 3. This phone call lasted 9 minutes and 42 seconds.   Meds ordered this encounter  Medications  . lisinopril (ZESTRIL) 20 MG tablet    Sig: Take 1 tablet (20 mg total) by mouth daily.    Dispense:  90 tablet    Refill:  0  . clorazepate (TRANXENE) 7.5 MG tablet    Sig: TAKE ONE TABLET DAILY AS NEEDED.    Dispense:  30 tablet    Refill:  1    Bracy Pepper Normajean Glasgow, MD

## 2020-01-26 ENCOUNTER — Ambulatory Visit: Payer: Medicare Other | Admitting: Family Medicine

## 2020-02-16 ENCOUNTER — Other Ambulatory Visit (INDEPENDENT_AMBULATORY_CARE_PROVIDER_SITE_OTHER): Payer: Self-pay | Admitting: Internal Medicine

## 2020-02-24 ENCOUNTER — Other Ambulatory Visit (INDEPENDENT_AMBULATORY_CARE_PROVIDER_SITE_OTHER): Payer: Self-pay | Admitting: Internal Medicine

## 2020-02-25 ENCOUNTER — Ambulatory Visit (INDEPENDENT_AMBULATORY_CARE_PROVIDER_SITE_OTHER): Payer: Medicare Other | Admitting: Internal Medicine

## 2020-02-25 ENCOUNTER — Other Ambulatory Visit: Payer: Self-pay

## 2020-02-25 ENCOUNTER — Encounter (INDEPENDENT_AMBULATORY_CARE_PROVIDER_SITE_OTHER): Payer: Self-pay | Admitting: Internal Medicine

## 2020-02-25 VITALS — BP 125/80 | HR 110 | Temp 99.2°F | Ht 65.0 in | Wt 183.4 lb

## 2020-02-25 DIAGNOSIS — E559 Vitamin D deficiency, unspecified: Secondary | ICD-10-CM

## 2020-02-25 DIAGNOSIS — I10 Essential (primary) hypertension: Secondary | ICD-10-CM

## 2020-02-25 NOTE — Progress Notes (Signed)
Metrics: Intervention Frequency ACO  Documented Smoking Status Yearly  Screened one or more times in 24 months  Cessation Counseling or  Active cessation medication Past 24 months  Past 24 months   Guideline developer: UpToDate (See UpToDate for funding source) Date Released: 2014       Wellness Office Visit  Subjective:  Patient ID: [facility], female    DOB: 07-19-1941  Age: 79 y.o. MRN: [id number]  CC: This lady comes in for follow-up of hypertension and vitamin D deficiency as well as hyperlipidemia. HPI  She is compliant with antihypertensive medication.  She denies any chest pain, dyspnea, palpitations or limb weakness. She is compliant with taking vitamin D3 supplementation for vitamin D deficiency. Past Medical History:  Diagnosis Date  . Anxiety   . Hemorrhoids   . Hernia of abdominal cavity   . Hyperlipidemia   . Hypertension   . Irritable bowel syndrome   . Osteopenia   . Vitamin D deficiency disease 10/16/2019      Family History  Problem Relation Age of Onset  . Breast cancer Sister   . Diabetes Sister   . Colon cancer Neg Hx   . Colon polyps Neg Hx   . Kidney disease Neg Hx   . Esophageal cancer Neg Hx   . Gallbladder disease Neg Hx     Social History   Social History Narrative   Divorced.Lives alone with dog.Retired.Previously Chartered certified accountant.   Social History   Tobacco Use  . Smoking status: Current Every Day Smoker    Packs/day: 0.50    Years: 45.00    Pack years: 22.50    Types: Cigarettes  . Smokeless tobacco: Never Used  Substance Use Topics  . Alcohol use: No    Alcohol/week: 0.0 standard drinks    Current Meds  Medication Sig  . amLODipine (NORVASC) 5 MG tablet TAKE 1 TABLET BY MOUTH ONCE A DAY.  Marland Kitchen Cholecalciferol (VITAMIN D-3) 125 MCG (5000 UT) TABS Take 1 tablet by mouth daily.  . clorazepate (TRANXENE) 7.5 MG tablet TAKE ONE TABLET DAILY AS NEEDED.  Marland Kitchen lisinopril (ZESTRIL) 20 MG tablet TAKE ONE TABLET BY MOUTH ONCE DAILY.    Marland Kitchen potassium chloride SA (KLOR-CON) 20 MEQ tablet Take 20 mEq by mouth 2 (two) times daily.       Objective:   Today's Vitals: BP 125/80 (BP Location: Left Arm, Patient Position: Sitting, Cuff Size: Normal)   Pulse (!) 110   Temp 99.2 F (37.3 C) (Temporal)   Ht 5\' 5"  (1.651 m)   Wt 183 lb 6.4 oz (83.2 kg)   SpO2 90%   BMI 30.52 kg/m  Vitals with BMI 02/25/2020 01/22/2020 10/16/2019  Height 5\' 5"  (No Data) 5\' 3"   Weight 183 lbs 6 oz (No Data) 184 lbs 3 oz  BMI 30.52 - 32.64  Systolic 125 (No Data) 124  Diastolic 80 (No Data) 88  Pulse 110 - 101     Physical Exam  She looks systemically well.  Blood pressure is well controlled.  She is alert and orientated without any focal neurological signs.     Assessment   1. Essential hypertension   2. Vitamin D deficiency disease       Tests ordered Orders Placed This Encounter  Procedures  . COMPLETE METABOLIC PANEL WITH GFR  . VITAMIN D 25 Hydroxy (Vit-D Deficiency, Fractures)     Plan: 1. Blood work is ordered above. 2. She will continue with antihypertensive therapy and this seems to be controlling  her blood pressure well. 3. She will continue with vitamin D3 supplementation for vitamin D deficiency and we will check levels today to see if we need to adjust the dose. 4. She will follow-up with Judson Roch in about 3 months time for an annual physical exam.  Further recommendations will depend on blood results.   No orders of the defined types were placed in this encounter.   Doree Albee, MD

## 2020-02-26 ENCOUNTER — Encounter (INDEPENDENT_AMBULATORY_CARE_PROVIDER_SITE_OTHER): Payer: Self-pay

## 2020-02-26 LAB — COMPLETE METABOLIC PANEL WITH GFR
AG Ratio: 1.4 (calc) (ref 1.0–2.5)
ALT: 10 U/L (ref 6–29)
AST: 17 U/L (ref 10–35)
Albumin: 4.2 g/dL (ref 3.6–5.1)
Alkaline phosphatase (APISO): 112 U/L (ref 37–153)
BUN/Creatinine Ratio: 14 (calc) (ref 6–22)
BUN: 13 mg/dL (ref 7–25)
CO2: 33 mmol/L — ABNORMAL HIGH (ref 20–32)
Calcium: 10.3 mg/dL (ref 8.6–10.4)
Chloride: 103 mmol/L (ref 98–110)
Creat: 0.94 mg/dL — ABNORMAL HIGH (ref 0.60–0.93)
GFR, Est African American: 68 mL/min/{1.73_m2} (ref >=60)
GFR, Est Non African American: 59 mL/min/{1.73_m2} — ABNORMAL LOW (ref >=60)
Globulin: 3.1 g/dL (calc) (ref 1.9–3.7)
Glucose, Bld: 98 mg/dL (ref 65–99)
Potassium: 4.1 mmol/L (ref 3.5–5.3)
Sodium: 141 mmol/L (ref 135–146)
Total Bilirubin: 0.8 mg/dL (ref 0.2–1.2)
Total Protein: 7.3 g/dL (ref 6.1–8.1)

## 2020-02-26 LAB — VITAMIN D 25 HYDROXY (VIT D DEFICIENCY, FRACTURES): Vit D, 25-Hydroxy: 35 ng/mL (ref 30–100)

## 2020-02-26 NOTE — Progress Notes (Signed)
Pt lab results and instructions sent via mail. Was not able to reach by [facility] phone.

## 2020-02-26 NOTE — Progress Notes (Signed)
Patient called. NO answer, no way to leave a message. Will mail letter out to patient on results and instructions.

## 2020-03-24 ENCOUNTER — Other Ambulatory Visit (INDEPENDENT_AMBULATORY_CARE_PROVIDER_SITE_OTHER): Payer: Self-pay | Admitting: Internal Medicine

## 2020-04-15 ENCOUNTER — Other Ambulatory Visit (INDEPENDENT_AMBULATORY_CARE_PROVIDER_SITE_OTHER): Payer: Self-pay | Admitting: Internal Medicine

## 2020-04-28 ENCOUNTER — Other Ambulatory Visit (INDEPENDENT_AMBULATORY_CARE_PROVIDER_SITE_OTHER): Payer: Self-pay | Admitting: Internal Medicine

## 2020-06-02 ENCOUNTER — Encounter (INDEPENDENT_AMBULATORY_CARE_PROVIDER_SITE_OTHER): Payer: Medicare Other | Admitting: Internal Medicine

## 2020-07-14 ENCOUNTER — Other Ambulatory Visit: Payer: Self-pay

## 2020-07-14 ENCOUNTER — Ambulatory Visit (INDEPENDENT_AMBULATORY_CARE_PROVIDER_SITE_OTHER): Payer: Medicare Other | Admitting: Internal Medicine

## 2020-07-14 ENCOUNTER — Encounter (INDEPENDENT_AMBULATORY_CARE_PROVIDER_SITE_OTHER): Payer: Self-pay

## 2020-07-14 ENCOUNTER — Encounter (INDEPENDENT_AMBULATORY_CARE_PROVIDER_SITE_OTHER): Payer: Self-pay | Admitting: Internal Medicine

## 2020-07-14 VITALS — BP 140/87 | HR 88 | Temp 98.0°F | Resp 18 | Ht 65.0 in | Wt 181.2 lb

## 2020-07-14 DIAGNOSIS — I10 Essential (primary) hypertension: Secondary | ICD-10-CM | POA: Diagnosis not present

## 2020-07-14 DIAGNOSIS — E559 Vitamin D deficiency, unspecified: Secondary | ICD-10-CM

## 2020-07-14 NOTE — Progress Notes (Signed)
Metrics: Intervention Frequency ACO  Documented Smoking Status Yearly  Screened one or more times in 24 months  Cessation Counseling or  Active cessation medication Past 24 months  Past 24 months   Guideline developer: UpToDate (See UpToDate for funding source) Date Released: 2014       Wellness Office Visit  Subjective:  Patient ID: [facility], female    DOB: 27-Apr-1941  Age: 79 y.o. MRN: [id number]  CC: This lady comes in for follow-up of hypertension and vitamin D deficiency.  She was scheduled as an annual physical but she was not expecting this and we will do this another time. HPI  She continues on lisinopril and amlodipine for hypertension and she is tolerating these medications well.  She is compliant with them. She also has been taking vitamin D3 10,000 units daily. She has no other major complaints apart from some low back pain which is intermittent. Past Medical History:  Diagnosis Date  . Anxiety   . Hemorrhoids   . Hernia of abdominal cavity   . Hyperlipidemia   . Hypertension   . Irritable bowel syndrome   . Osteopenia   . Vitamin D deficiency disease 10/16/2019   Past Surgical History:  Procedure Laterality Date  . EYE SURGERY Bilateral    lasix   . NECK SURGERY       Family History  Problem Relation Age of Onset  . Breast cancer Sister   . Diabetes Sister   . Colon cancer Neg Hx   . Colon polyps Neg Hx   . Kidney disease Neg Hx   . Esophageal cancer Neg Hx   . Gallbladder disease Neg Hx     Social History   Social History Narrative   Divorced.Lives alone with dog.Retired.Previously Chartered certified accountant.   Social History   Tobacco Use  . Smoking status: Current Every Day Smoker    Packs/day: 0.50    Years: 45.00    Pack years: 22.50    Types: Cigarettes  . Smokeless tobacco: Never Used  Substance Use Topics  . Alcohol use: No    Alcohol/week: 0.0 standard drinks    Current Meds  Medication Sig  . amLODipine (NORVASC) 5 MG tablet TAKE 1  TABLET BY MOUTH ONCE A DAY.  Marland Kitchen Cholecalciferol (VITAMIN D-3) 125 MCG (5000 UT) TABS Take 1 tablet by mouth daily.  . clorazepate (TRANXENE) 7.5 MG tablet TAKE ONE TABLET DAILY AS NEEDED.  Marland Kitchen lisinopril (ZESTRIL) 20 MG tablet TAKE ONE TABLET BY MOUTH ONCE DAILY.  Marland Kitchen potassium chloride SA (KLOR-CON) 20 MEQ tablet TAKE (1) TABLET BY MOUTH TWICE DAILY.      Depression screen [facility] 2/9 07/14/2020 02/25/2020  Decreased Interest 0 0  Down, Depressed, Hopeless 3 0  PHQ - 2 Score 3 0  Altered sleeping 0 -  Tired, decreased energy 0 -  Change in appetite 1 -  Feeling bad or failure about yourself  0 -  Trouble concentrating 0 -  Moving slowly or fidgety/restless 0 -  Suicidal thoughts 0 -  PHQ-9 Score 4 -  Difficult doing work/chores Somewhat difficult -     Objective:   Today's Vitals: BP 140/87 (BP Location: Right Arm, Patient Position: Sitting, Cuff Size: Normal)   Pulse 88   Temp 98 F (36.7 C) (Temporal)   Resp 18   Ht 5\' 5"  (1.651 m)   Wt 181 lb 3.2 oz (82.2 kg)   SpO2 93%   BMI 30.15 kg/m  Vitals with BMI 07/14/2020 02/25/2020 01/22/2020  Height 5\' 5"  5\' 5"  (No Data)  Weight 181 lbs 3 oz 183 lbs 6 oz (No Data)  BMI 30.15 30.52 -  Systolic 140 125 (No Data)  Diastolic 87 80 (No Data)  Pulse 88 110 -     Physical Exam She looks systemically well.  Blood pressure acceptable for her age.  She is alert and orientated without any focal neurological signs.      Assessment   1. Vitamin D deficiency disease   2. Essential hypertension       Tests ordered Orders Placed This Encounter  Procedures  . COMPLETE METABOLIC PANEL WITH GFR  . VITAMIN D 25 Hydroxy (Vit-D Deficiency, Fractures)     Plan: 1. She will continue with vitamin D3 10,000 units daily and I will check levels today. 2. She will continue with amlodipine and lisinopril and we will check renal function today. 3. I also spent some time today encouraging the patient to get COVID-19 vaccination. 4. Further  recommendations will depend on blood results and she will follow-up with Sarah in 3 months time for an annual physical exam.   No orders of the defined types were placed in this encounter.   , MD

## 2020-07-15 LAB — COMPLETE METABOLIC PANEL WITH GFR
AG Ratio: 1.2 (calc) (ref 1.0–2.5)
ALT: 10 U/L (ref 6–29)
AST: 17 U/L (ref 10–35)
Albumin: 3.9 g/dL (ref 3.6–5.1)
Alkaline phosphatase (APISO): 106 U/L (ref 37–153)
BUN: 8 mg/dL (ref 7–25)
CO2: 31 mmol/L (ref 20–32)
Calcium: 9.5 mg/dL (ref 8.6–10.4)
Chloride: 103 mmol/L (ref 98–110)
Creat: 0.81 mg/dL (ref 0.60–0.93)
GFR, Est African American: 81 mL/min/{1.73_m2} (ref >=60)
GFR, Est Non African American: 70 mL/min/{1.73_m2} (ref >=60)
Globulin: 3.2 g/dL (calc) (ref 1.9–3.7)
Glucose, Bld: 94 mg/dL (ref 65–99)
Potassium: 4 mmol/L (ref 3.5–5.3)
Sodium: 141 mmol/L (ref 135–146)
Total Bilirubin: 0.8 mg/dL (ref 0.2–1.2)
Total Protein: 7.1 g/dL (ref 6.1–8.1)

## 2020-07-15 LAB — VITAMIN D 25 HYDROXY (VIT D DEFICIENCY, FRACTURES): Vit D, 25-Hydroxy: 48 ng/mL (ref 30–100)

## 2020-07-15 NOTE — Progress Notes (Signed)
Pt was given lab result and will continue to take medications. Will see [place] on 12/19/19 for follow up.

## 2020-07-15 NOTE — Progress Notes (Signed)
Please call this patient and let her know that her blood tests are better and vitamin D levels have also improved.  Continue with all the same doses of vitamin D3.  Follow-up as scheduled.

## 2020-07-21 ENCOUNTER — Other Ambulatory Visit (INDEPENDENT_AMBULATORY_CARE_PROVIDER_SITE_OTHER): Payer: Self-pay | Admitting: Internal Medicine

## 2020-09-02 ENCOUNTER — Other Ambulatory Visit (INDEPENDENT_AMBULATORY_CARE_PROVIDER_SITE_OTHER): Payer: Self-pay | Admitting: Internal Medicine

## 2020-10-18 ENCOUNTER — Encounter (INDEPENDENT_AMBULATORY_CARE_PROVIDER_SITE_OTHER): Payer: Medicare Other | Admitting: Nurse Practitioner

## 2020-10-26 ENCOUNTER — Ambulatory Visit (INDEPENDENT_AMBULATORY_CARE_PROVIDER_SITE_OTHER): Payer: Medicare Other | Admitting: Nurse Practitioner

## 2020-10-26 ENCOUNTER — Encounter (INDEPENDENT_AMBULATORY_CARE_PROVIDER_SITE_OTHER): Payer: Self-pay | Admitting: Nurse Practitioner

## 2020-10-26 ENCOUNTER — Other Ambulatory Visit: Payer: Self-pay

## 2020-10-26 VITALS — BP 128/84 | HR 104 | Temp 98.2°F | Ht 61.5 in | Wt 180.4 lb

## 2020-10-26 DIAGNOSIS — Z0001 Encounter for general adult medical examination with abnormal findings: Secondary | ICD-10-CM

## 2020-10-26 DIAGNOSIS — E782 Mixed hyperlipidemia: Secondary | ICD-10-CM

## 2020-10-26 DIAGNOSIS — Z720 Tobacco use: Secondary | ICD-10-CM

## 2020-10-26 DIAGNOSIS — Z9189 Other specified personal risk factors, not elsewhere classified: Secondary | ICD-10-CM

## 2020-10-26 DIAGNOSIS — I10 Essential (primary) hypertension: Secondary | ICD-10-CM

## 2020-10-26 DIAGNOSIS — E66811 Obesity, class 1: Secondary | ICD-10-CM

## 2020-10-26 DIAGNOSIS — E669 Obesity, unspecified: Secondary | ICD-10-CM

## 2020-10-26 DIAGNOSIS — E559 Vitamin D deficiency, unspecified: Secondary | ICD-10-CM

## 2020-10-26 NOTE — Progress Notes (Signed)
Subjective:  Patient ID: Valerie Marshall, female    DOB: 1941-08-23  Age: 79 y.o. MRN: [id number]  CC:  Chief Complaint  Patient presents with  . Annual Exam    states she is doing well, no concerns      HPI  This patient arrives today for the above.  Immunizations: She is due for PPSV23 as well as COVID-19 vaccines.  Unfortunately we ran out of the PPSV23 vaccines earlier today, but she would be willing to have this administered at next office visit.  She is not willing to have COVID-19 vaccine administered at this time.  Health maintenance: She is not interested in having sexual transmitted infection screening completed today.  She is a current smoker with a pack year of 94.5 (63*1.5).  She is not interested in quitting cigarette smoking at this time and she is not interested in undergoing lung cancer screening with CT scan despite conversation regarding benefits versus risks of CT scan.  She is due for depression screening today and fall screening today.  She would be due for hepatitis C screening but would not like to have this completed.  Last bone density scan was completed 2017, she is not interested in having this redone at this time.  She tells me last colon cancer screening was completed a few years ago and was normal.  She is also aged out of mammogram recommendations but will consider having screening mammogram completed in the near future.  She does not want to have clinical breast exam completed today.   She has no acute complaints or concerns today.   Past Medical History:  Diagnosis Date  . Anxiety   . Hemorrhoids   . Hernia of abdominal cavity   . Hyperlipidemia   . Hypertension   . Irritable bowel syndrome   . Osteopenia   . Vitamin D deficiency disease 10/16/2019      Family History  Problem Relation Age of Onset  . Breast cancer Sister   . Diabetes Sister   . Colon cancer Neg Hx   . Colon polyps Neg Hx   . Kidney disease Neg Hx   . Esophageal  cancer Neg Hx   . Gallbladder disease Neg Hx     Social History   Social History Narrative   Divorced.Lives alone with dog.Retired.Previously Furniture conservator/restorer.   Social History   Tobacco Use  . Smoking status: Current Every Day Smoker    Packs/day: 0.50    Years: 45.00    Pack years: 22.50    Types: Cigarettes  . Smokeless tobacco: Never Used  Substance Use Topics  . Alcohol use: No    Alcohol/week: 0.0 standard drinks     Current Meds  Medication Sig  . amLODipine (NORVASC) 5 MG tablet TAKE 1 TABLET BY MOUTH ONCE A DAY.  Marland Kitchen Cholecalciferol (VITAMIN D-3) 125 MCG (5000 UT) TABS Take 1 tablet by mouth daily.  . clorazepate (TRANXENE) 7.5 MG tablet TAKE ONE TABLET DAILY AS NEEDED.  Marland Kitchen lisinopril (ZESTRIL) 20 MG tablet TAKE ONE TABLET BY MOUTH ONCE DAILY.  Marland Kitchen potassium chloride SA (KLOR-CON) 20 MEQ tablet TAKE (1) TABLET BY MOUTH TWICE DAILY.    ROS:  Review of Systems  Constitutional: Negative for malaise/fatigue.  Eyes: Negative for blurred vision.  Respiratory: Negative for shortness of breath.   Cardiovascular: Negative for chest pain.  Neurological: Negative for dizziness.     Objective:   Today's Vitals: BP 128/84   Pulse (!) 104  Temp 98.2 F (36.8 C) (Temporal)   Ht 5' 1.5" (1.562 m)   Wt 180 lb 6.4 oz (81.8 kg)   SpO2 98%   BMI 33.53 kg/m  Vitals with BMI 10/26/2020 07/14/2020 02/25/2020  Height 5' 1.5" '5\' 5"'  '5\' 5"'   Weight 180 lbs 6 oz 181 lbs 3 oz 183 lbs 6 oz  BMI 33.54 29.29 09.03  Systolic 014 996 924  Diastolic 84 87 80  Pulse 932 88 110     Physical Exam Vitals reviewed.  Constitutional:      Appearance: Normal appearance.  HENT:     Head: Normocephalic and atraumatic.     Right Ear: Tympanic membrane, ear canal and external ear normal.     Left Ear: Tympanic membrane, ear canal and external ear normal.  Eyes:     General:        Right eye: No discharge.        Left eye: No discharge.     Extraocular Movements: Extraocular movements intact.       Conjunctiva/sclera: Conjunctivae normal.     Pupils: Pupils are equal, round, and reactive to light.  Neck:     Vascular: No carotid bruit.  Cardiovascular:     Rate and Rhythm: Normal rate and regular rhythm.     Pulses: Normal pulses.     Heart sounds: Normal heart sounds. No murmur heard.   Pulmonary:     Effort: Pulmonary effort is normal.     Breath sounds: Normal breath sounds.  Chest:     Comments: Deferred per patient preference Abdominal:     General: Abdomen is flat. Bowel sounds are normal. There is no distension.     Palpations: Abdomen is soft. There is no mass.     Tenderness: There is no abdominal tenderness.  Musculoskeletal:        General: No tenderness.     Cervical back: Neck supple. No muscular tenderness.     Right lower leg: No edema.     Left lower leg: No edema.  Lymphadenopathy:     Cervical: No cervical adenopathy.     Upper Body:     Right upper body: No supraclavicular adenopathy.     Left upper body: No supraclavicular adenopathy.  Skin:    General: Skin is warm and dry.  Neurological:     General: No focal deficit present.     Mental Status: She is alert and oriented to person, place, and time.     Motor: No weakness.     Gait: Gait normal.  Psychiatric:        Mood and Affect: Mood normal.        Behavior: Behavior normal.        Judgment: Judgment normal.       PHQ9 SCORE ONLY 10/26/2020 07/14/2020 02/25/2020  PHQ-9 Total Score 0 4 0    Fall Risk  10/26/2020 02/25/2020  Falls in the past year? 0 0  Number falls in past yr: - 0  Injury with Fall? - 0  Risk for fall due to : - No Fall Risks  Follow up - Falls evaluation completed     Assessment and Plan   1. Encounter for general adult medical examination with abnormal findings   2. Streptococcus pneumoniae vaccination indicated   3. Essential hypertension   4. Tobacco abuse   5. Mixed hyperlipidemia   6. Vitamin D deficiency disease   7. Obesity (BMI 30.0-34.9)  Plan: 1.-7.  We will administer PPSV23 vaccine at next office visit when we have this in stock.  Per patient preference will not complete sexually-transmitted infection screening, hepatitis C screening, lung cancer screening, or osteoporosis screening.  Depression and fall screening completed today and were both negative or low risk.  As stated above she may consider doing breast cancer screening despite having aged out of recommendation.  I do not think she needs to have additional colon cancer screening at this time.  We will collect blood work for further evaluation of chronic conditions as well as part of her annual exam.   Tests ordered Orders Placed This Encounter  Procedures  . CBC with Differential/Platelets  . Lipid Panel  . TSH  . CMP with eGFR([facility])  . Vitamin D, 25-hydroxy      No orders of the defined types were placed in this encounter.   Patient to follow-up in 3 months or sooner as needed.  Ailene Ards, NP

## 2020-10-26 NOTE — Patient Instructions (Addendum)
To schedule an appointment with [facility] for your lab draw visit [email or web address] or Call: [phone]. Or you may go to [facility] as a walk-in. Their [place] location address is [address], [place], [place]. Their hours are Monday-Friday from 7:00AM-12:00PM and 1:00PM-5:00PM.      Pneumococcal Polysaccharide Vaccine (PPSV23): What You Need to Know 1. Why get vaccinated? Pneumococcal polysaccharide vaccine (PPSV23) can prevent pneumococcal disease. Pneumococcal disease refers to any illness caused by pneumococcal bacteria. These bacteria can cause many types of illnesses, including pneumonia, which is an infection of the lungs. Pneumococcal bacteria are one of the most common causes of pneumonia. Besides pneumonia, pneumococcal bacteria can also cause:  Ear infections  Sinus infections  Meningitis (infection of the tissue covering the brain and spinal cord)  Bacteremia (bloodstream infection) Anyone can get pneumococcal disease, but children under 28 years of age, people with certain medical conditions, adults 65 years or older, and cigarette smokers are at the highest risk. Most pneumococcal infections are mild. However, some can result in long-term problems, such as brain damage or hearing loss. Meningitis, bacteremia, and pneumonia caused by pneumococcal disease can be fatal. 2. PPSV23 PPSV23 protects against 23 types of bacteria that cause pneumococcal disease. PPSV23 is recommended for:  All adults 65 years or older,  Anyone 2 years or older with certain medical conditions that can lead to an increased risk for pneumococcal disease. Most people need only one dose of PPSV23. A second dose of PPSV23, and another type of pneumococcal vaccine called PCV13, are recommended for certain high-risk groups. Your health care provider can give you more information. People 65 years or older should get a dose of PPSV23 even if they have already gotten one or more doses  of the vaccine before they turned 4. 3. Talk with your health care provider Tell your vaccine provider if the person getting the vaccine:  Has had an allergic reaction after a previous dose of PPSV23, or has any severe, life-threatening allergies. In some cases, your health care provider may decide to postpone PPSV23 vaccination to a future visit. People with minor illnesses, such as a cold, may be vaccinated. People who are moderately or severely ill should usually wait until they recover before getting PPSV23. Your health care provider can give you more information. 4. Risks of a vaccine reaction  Redness or pain where the shot is given, feeling tired, fever, or muscle aches can happen after PPSV23. People sometimes faint after medical procedures, including vaccination. Tell your provider if you feel dizzy or have vision changes or ringing in the ears. As with any medicine, there is a very remote chance of a vaccine causing a severe allergic reaction, other serious injury, or death. 5. What if there is a serious problem? An allergic reaction could occur after the vaccinated person leaves the clinic. If you see signs of a severe allergic reaction (hives, swelling of the face and throat, difficulty breathing, a fast heartbeat, dizziness, or weakness), call 9-1-1 and get the person to the nearest hospital. For other signs that concern you, call your health care provider. Adverse reactions should be reported to the Vaccine Adverse Event Reporting System (VAERS). Your health care provider will usually file this report, or you can do it yourself. Visit the VAERS website at www.vaers.LAgents.no or call 747-663-2764. VAERS is only for reporting reactions, and VAERS staff do not give medical advice. 6. How can I learn more?  Ask your health care provider.  Call your local or  state health department.  Contact the Centers for Disease Control and Prevention (CDC): ? Call (816) 060-0119 (1-800-CDC-INFO)  or ? Visit CDC's website at PicCapture.uy CDC Vaccine Information Statement PPSV23 Vaccine (09/25/2018) This information is not intended to replace advice given to you by your health care provider. Make sure you discuss any questions you have with your health care provider. Document Revised: 03/04/2019 Document Reviewed: 06/25/2018 Elsevier Patient Education  2020 [employer].

## 2020-11-02 DIAGNOSIS — I10 Essential (primary) hypertension: Secondary | ICD-10-CM | POA: Diagnosis not present

## 2020-11-02 DIAGNOSIS — E782 Mixed hyperlipidemia: Secondary | ICD-10-CM | POA: Diagnosis not present

## 2020-11-02 DIAGNOSIS — Z9189 Other specified personal risk factors, not elsewhere classified: Secondary | ICD-10-CM | POA: Diagnosis not present

## 2020-11-02 DIAGNOSIS — E559 Vitamin D deficiency, unspecified: Secondary | ICD-10-CM | POA: Diagnosis not present

## 2020-11-03 LAB — CBC WITH DIFFERENTIAL/PLATELET
Absolute Monocytes: 552 cells/uL (ref 200–950)
Basophils Absolute: 37 cells/uL (ref 0–200)
Basophils Relative: 0.6 %
Eosinophils Absolute: 130 cells/uL (ref 15–500)
Eosinophils Relative: 2.1 %
HCT: 46 % — ABNORMAL HIGH (ref 35.0–45.0)
Hemoglobin: 15.2 g/dL (ref 11.7–15.5)
Lymphs Abs: 1848 cells/uL (ref 850–3900)
MCH: 29.3 pg (ref 27.0–33.0)
MCHC: 33 g/dL (ref 32.0–36.0)
MCV: 88.6 fL (ref 80.0–100.0)
MPV: 11 fL (ref 7.5–12.5)
Monocytes Relative: 8.9 %
Neutro Abs: 3633 cells/uL (ref 1500–7800)
Neutrophils Relative %: 58.6 %
Platelets: 280 10*3/uL (ref 140–400)
RBC: 5.19 10*6/uL — ABNORMAL HIGH (ref 3.80–5.10)
RDW: 12.4 % (ref 11.0–15.0)
Total Lymphocyte: 29.8 %
WBC: 6.2 10*3/uL (ref 3.8–10.8)

## 2020-11-03 LAB — COMPLETE METABOLIC PANEL WITH GFR
AG Ratio: 1.2 (calc) (ref 1.0–2.5)
ALT: 9 U/L (ref 6–29)
AST: 16 U/L (ref 10–35)
Albumin: 4.1 g/dL (ref 3.6–5.1)
Alkaline phosphatase (APISO): 109 U/L (ref 37–153)
BUN: 7 mg/dL (ref 7–25)
CO2: 33 mmol/L — ABNORMAL HIGH (ref 20–32)
Calcium: 9.5 mg/dL (ref 8.6–10.4)
Chloride: 102 mmol/L (ref 98–110)
Creat: 0.78 mg/dL (ref 0.60–0.93)
GFR, Est African American: 84 mL/min/{1.73_m2} (ref >=60)
GFR, Est Non African American: 73 mL/min/{1.73_m2} (ref >=60)
Globulin: 3.4 g/dL (calc) (ref 1.9–3.7)
Glucose, Bld: 101 mg/dL — ABNORMAL HIGH (ref 65–99)
Potassium: 3.4 mmol/L — ABNORMAL LOW (ref 3.5–5.3)
Sodium: 142 mmol/L (ref 135–146)
Total Bilirubin: 0.8 mg/dL (ref 0.2–1.2)
Total Protein: 7.5 g/dL (ref 6.1–8.1)

## 2020-11-03 LAB — LIPID PANEL
Cholesterol: 198 mg/dL (ref <200)
HDL: 40 mg/dL — ABNORMAL LOW (ref >=50)
LDL Cholesterol (Calc): 114 mg/dL (calc) — ABNORMAL HIGH
Non-HDL Cholesterol (Calc): 158 mg/dL (calc) — ABNORMAL HIGH (ref <130)
Total CHOL/HDL Ratio: 5 (calc) — ABNORMAL HIGH (ref <5.0)
Triglycerides: 328 mg/dL — ABNORMAL HIGH (ref <150)

## 2020-11-03 LAB — TSH: TSH: 2.21 mIU/L (ref 0.40–4.50)

## 2020-11-03 LAB — VITAMIN D 25 HYDROXY (VIT D DEFICIENCY, FRACTURES): Vit D, 25-Hydroxy: 67 ng/mL (ref 30–100)

## 2020-11-11 ENCOUNTER — Other Ambulatory Visit (INDEPENDENT_AMBULATORY_CARE_PROVIDER_SITE_OTHER): Payer: Self-pay | Admitting: Nurse Practitioner

## 2020-11-11 ENCOUNTER — Other Ambulatory Visit (INDEPENDENT_AMBULATORY_CARE_PROVIDER_SITE_OTHER): Payer: Medicare Other

## 2020-11-11 ENCOUNTER — Other Ambulatory Visit: Payer: Self-pay

## 2020-11-11 DIAGNOSIS — E876 Hypokalemia: Secondary | ICD-10-CM | POA: Diagnosis not present

## 2020-11-11 NOTE — Progress Notes (Signed)
Repeat cmp to monitor hypokalemia.

## 2020-11-12 LAB — COMPLETE METABOLIC PANEL WITH GFR
AG Ratio: 1.3 (calc) (ref 1.0–2.5)
ALT: 9 U/L (ref 6–29)
AST: 18 U/L (ref 10–35)
Albumin: 3.9 g/dL (ref 3.6–5.1)
Alkaline phosphatase (APISO): 99 U/L (ref 37–153)
BUN: 9 mg/dL (ref 7–25)
CO2: 32 mmol/L (ref 20–32)
Calcium: 9.6 mg/dL (ref 8.6–10.4)
Chloride: 102 mmol/L (ref 98–110)
Creat: 0.85 mg/dL (ref 0.60–0.93)
GFR, Est African American: 76 mL/min/{1.73_m2} (ref >=60)
GFR, Est Non African American: 66 mL/min/{1.73_m2} (ref >=60)
Globulin: 3.1 g/dL (calc) (ref 1.9–3.7)
Glucose, Bld: 95 mg/dL (ref 65–99)
Potassium: 3.6 mmol/L (ref 3.5–5.3)
Sodium: 140 mmol/L (ref 135–146)
Total Bilirubin: 1.1 mg/dL (ref 0.2–1.2)
Total Protein: 7 g/dL (ref 6.1–8.1)

## 2020-11-15 ENCOUNTER — Other Ambulatory Visit (INDEPENDENT_AMBULATORY_CARE_PROVIDER_SITE_OTHER): Payer: Self-pay | Admitting: Internal Medicine

## 2020-12-08 ENCOUNTER — Other Ambulatory Visit (INDEPENDENT_AMBULATORY_CARE_PROVIDER_SITE_OTHER): Payer: Self-pay | Admitting: Internal Medicine

## 2020-12-27 ENCOUNTER — Other Ambulatory Visit (INDEPENDENT_AMBULATORY_CARE_PROVIDER_SITE_OTHER): Payer: Self-pay | Admitting: Internal Medicine

## 2021-01-27 ENCOUNTER — Ambulatory Visit (INDEPENDENT_AMBULATORY_CARE_PROVIDER_SITE_OTHER): Payer: Medicare Other | Admitting: Nurse Practitioner

## 2021-01-27 ENCOUNTER — Other Ambulatory Visit: Payer: Self-pay

## 2021-01-27 ENCOUNTER — Encounter (INDEPENDENT_AMBULATORY_CARE_PROVIDER_SITE_OTHER): Payer: Self-pay | Admitting: Nurse Practitioner

## 2021-01-27 VITALS — BP 114/78 | HR 102 | Temp 97.8°F | Ht 61.5 in | Wt 176.6 lb

## 2021-01-27 DIAGNOSIS — M8580 Other specified disorders of bone density and structure, unspecified site: Secondary | ICD-10-CM | POA: Diagnosis not present

## 2021-01-27 DIAGNOSIS — I10 Essential (primary) hypertension: Secondary | ICD-10-CM | POA: Diagnosis not present

## 2021-01-27 DIAGNOSIS — E559 Vitamin D deficiency, unspecified: Secondary | ICD-10-CM | POA: Diagnosis not present

## 2021-01-27 DIAGNOSIS — F419 Anxiety disorder, unspecified: Secondary | ICD-10-CM | POA: Diagnosis not present

## 2021-01-27 MED ORDER — HYDROXYZINE HCL 10 MG PO TABS: 10.0000 mg | ORAL_TABLET | Freq: Every evening | ORAL | 0 refills | Status: DC | PRN

## 2021-01-27 NOTE — Progress Notes (Signed)
Subjective:  Patient ID: Valerie Marshall, female    DOB: 1941-04-05  Age: 80 y.o. MRN: [id number]  CC:  Chief Complaint  Patient presents with  . Follow-up    Doing okay and no concerns  . Hypertension  . Anxiety  . Other    Osteopenia, vitamin D deficiency      HPI  This patient arrives today for the above.  Hypertension: She continues on amlodipine and lisinopril.  Is tolerating his medications well.  Anxiety: She does have a history of anxiety and currently takes clorazepate once a day as needed.  She tells me with the current complex between [place] in [place] her anxiety level has increased tremendously.  She is having a really hard time sleeping and is wondering if she can increase her dose of the clorazepate or try another medication to treat this.  Osteopenia: She has a history of osteopenia but is not interested in undergoing additional bone density scanning because the scan itself is uncomfortable for her.  She does continue on vitamin D supplement.  Vitamin D deficiency: As stated above she does continue on a vitamin D 3 supplement she tells me she takes 5000 to 10,000 IUs daily.  Last serum check showed a level of 67.  Past Medical History:  Diagnosis Date  . Anxiety   . Hemorrhoids   . Hernia of abdominal cavity   . Hyperlipidemia   . Hypertension   . Irritable bowel syndrome   . Osteopenia   . Vitamin D deficiency disease 10/16/2019      Family History  Problem Relation Age of Onset  . Breast cancer Sister   . Diabetes Sister   . Colon cancer Neg Hx   . Colon polyps Neg Hx   . Kidney disease Neg Hx   . Esophageal cancer Neg Hx   . Gallbladder disease Neg Hx     Social History   Social History Narrative   Divorced.Lives alone with dog.Retired.Previously Chartered certified accountant.   Social History   Tobacco Use  . Smoking status: Current Every Day Smoker    Packs/day: 0.50    Years: 45.00    Pack years: 22.50    Types: Cigarettes  . Smokeless  tobacco: Never Used  Substance Use Topics  . Alcohol use: No    Alcohol/week: 0.0 standard drinks     Current Meds  Medication Sig  . amLODipine (NORVASC) 5 MG tablet TAKE 1 TABLET BY MOUTH ONCE A DAY.  Marland Kitchen Cholecalciferol (VITAMIN D-3) 125 MCG (5000 UT) TABS Take 1 tablet by mouth daily.  . clorazepate (TRANXENE) 7.5 MG tablet TAKE ONE TABLET DAILY AS NEEDED.  . hydrOXYzine (ATARAX/VISTARIL) 10 MG tablet Take 1 tablet (10 mg total) by mouth at bedtime as needed for anxiety.  Marland Kitchen lisinopril (ZESTRIL) 20 MG tablet TAKE ONE TABLET BY MOUTH ONCE DAILY.  Marland Kitchen potassium chloride SA (KLOR-CON) 20 MEQ tablet TAKE (1) TABLET BY MOUTH TWICE DAILY.    ROS:  Review of Systems  Constitutional: Negative for fever.  Eyes: Negative for blurred vision.  Respiratory: Negative for shortness of breath.   Cardiovascular: Negative for chest pain.  Neurological: Negative for dizziness and headaches.  Psychiatric/Behavioral: The patient is nervous/anxious and has insomnia.      Objective:   Today's Vitals: BP 114/78   Pulse (!) 102   Temp 97.8 F (36.6 C) (Temporal)   Ht 5' 1.5" (1.562 m)   Wt 176 lb 9.6 oz (80.1 kg)   SpO2 95%  BMI 32.83 kg/m  Vitals with BMI 01/27/2021 10/26/2020 07/14/2020  Height 5' 1.5" 5' 1.5" 5\' 5"   Weight 176 lbs 10 oz 180 lbs 6 oz 181 lbs 3 oz  BMI 32.83 33.54 30.15  Systolic 114 128  Diastolic 78 84 87  Pulse 102 104 88     Physical Exam Vitals reviewed.  Constitutional:      General: She is not in acute distress.    Appearance: Normal appearance.  HENT:     Head: Normocephalic and atraumatic.  Cardiovascular:     Rate and Rhythm: Normal rate and regular rhythm.     Pulses: Normal pulses.     Heart sounds: Normal heart sounds.  Pulmonary:     Effort: Pulmonary effort is normal.     Breath sounds: Normal breath sounds.  Skin:    General: Skin is warm and dry.  Neurological:     General: No focal deficit present.     Mental Status: She is alert and  oriented to person, place, and time.  Psychiatric:        Mood and Affect: Mood normal.        Behavior: Behavior normal.        Judgment: Judgment normal.          Assessment and Plan   1. Anxiety   2. Vitamin D deficiency disease   3. Essential hypertension   4. Osteopenia, unspecified location      Plan: 1.  I told her because clorazepate is a benzodiazepine I prefer to try a different medication as opposed to increasing this dose first.  Hopefully this anxiety is transient and is situational.  She is agreeable to this on per shared decision making we have opted to try hydroxyzine 10-20mg  by mouth at night as needed for anxiety.  I did tell her that this can make her quite sleepy so she should not take the hydroxyzine and the clorazepate at the same time.  She should instead take her clorazepate during the day as needed and use hydroxyzine slowly for nighttime as needed.  She tells me she understands. 2.  She will continue taking her vitamin D supplement, consider checking vitamin D level at next office visit. 3.  She continue on her antihypertensives as currently prescribed. 4.  We will not order DEXA scan per patient preference, but patient will continue to take her vitamin D supplement and will continue to participate in weightbearing activities.  Of note, she is due for PPSV23 vaccination.  We will have to administer this at next office visit.   Tests ordered No orders of the defined types were placed in this encounter.     Meds ordered this encounter  Medications  . hydrOXYzine (ATARAX/VISTARIL) 10 MG tablet    Sig: Take 1 tablet (10 mg total) by mouth at bedtime as needed for anxiety.    Dispense:  30 tablet    Refill:  0    Order Specific Question:   Supervising Provider    Answer:   [id number]]    Patient to follow-up in 6 weeks or sooner as needed.  Wilson Singer, NP

## 2021-02-05 ENCOUNTER — Other Ambulatory Visit (INDEPENDENT_AMBULATORY_CARE_PROVIDER_SITE_OTHER): Payer: Self-pay | Admitting: Internal Medicine

## 2021-03-09 ENCOUNTER — Other Ambulatory Visit (INDEPENDENT_AMBULATORY_CARE_PROVIDER_SITE_OTHER): Payer: Self-pay | Admitting: Internal Medicine

## 2021-03-10 ENCOUNTER — Other Ambulatory Visit: Payer: Self-pay

## 2021-03-10 ENCOUNTER — Ambulatory Visit (INDEPENDENT_AMBULATORY_CARE_PROVIDER_SITE_OTHER): Payer: Medicare Other | Admitting: Nurse Practitioner

## 2021-03-10 ENCOUNTER — Encounter (INDEPENDENT_AMBULATORY_CARE_PROVIDER_SITE_OTHER): Payer: Self-pay | Admitting: Nurse Practitioner

## 2021-03-10 VITALS — BP 132/82 | HR 96 | Temp 97.7°F | Ht 61.5 in | Wt 179.6 lb

## 2021-03-10 DIAGNOSIS — Z23 Encounter for immunization: Secondary | ICD-10-CM

## 2021-03-10 DIAGNOSIS — E559 Vitamin D deficiency, unspecified: Secondary | ICD-10-CM

## 2021-03-10 DIAGNOSIS — F419 Anxiety disorder, unspecified: Secondary | ICD-10-CM | POA: Diagnosis not present

## 2021-03-10 MED ORDER — CLORAZEPATE DIPOTASSIUM 7.5 MG PO TABS: 7.5000 mg | ORAL_TABLET | Freq: Two times a day (BID) | ORAL | 0 refills | Status: DC | PRN

## 2021-03-10 NOTE — Patient Instructions (Signed)
Pneumococcal Polysaccharide Vaccine (PPSV23): What You Need to Know 1. Why get vaccinated? Pneumococcal polysaccharide vaccine (PPSV23) can prevent pneumococcal disease. Pneumococcal disease refers to any illness caused by pneumococcal bacteria. These bacteria can cause many types of illnesses, including pneumonia, which is an infection of the lungs. Pneumococcal bacteria are one of the most common causes of pneumonia. Besides pneumonia, pneumococcal bacteria can also cause:  Ear infections  Sinus infections  Meningitis (infection of the tissue covering the brain and spinal cord)  Bacteremia (bloodstream infection) Anyone can get pneumococcal disease, but children under 2 years of age, people with certain medical conditions, adults 65 years or older, and cigarette smokers are at the highest risk. Most pneumococcal infections are mild. However, some can result in long-term problems, such as brain damage or hearing loss. Meningitis, bacteremia, and pneumonia caused by pneumococcal disease can be fatal. 2. PPSV23 PPSV23 protects against 23 types of bacteria that cause pneumococcal disease. PPSV23 is recommended for:  All adults 65 years or older,  Anyone 2 years or older with certain medical conditions that can lead to an increased risk for pneumococcal disease. Most people need only one dose of PPSV23. A second dose of PPSV23, and another type of pneumococcal vaccine called PCV13, are recommended for certain high-risk groups. Your health care provider can give you more information. People 65 years or older should get a dose of PPSV23 even if they have already gotten one or more doses of the vaccine before they turned 65. 3. Talk with your health care provider Tell your vaccine provider if the person getting the vaccine:  Has had an allergic reaction after a previous dose of PPSV23, or has any severe, life-threatening allergies. In some cases, your health care provider may decide to postpone  PPSV23 vaccination to a future visit. People with minor illnesses, such as a cold, may be vaccinated. People who are moderately or severely ill should usually wait until they recover before getting PPSV23. Your health care provider can give you more information. 4. Risks of a vaccine reaction  Redness or pain where the shot is given, feeling tired, fever, or muscle aches can happen after PPSV23. People sometimes faint after medical procedures, including vaccination. Tell your provider if you feel dizzy or have vision changes or ringing in the ears. As with any medicine, there is a very remote chance of a vaccine causing a severe allergic reaction, other serious injury, or death. 5. What if there is a serious problem? An allergic reaction could occur after the vaccinated person leaves the clinic. If you see signs of a severe allergic reaction (hives, swelling of the face and throat, difficulty breathing, a fast heartbeat, dizziness, or weakness), call 9-1-1 and get the person to the nearest hospital. For other signs that concern you, call your health care provider. Adverse reactions should be reported to the Vaccine Adverse Event Reporting System (VAERS). Your health care provider will usually file this report, or you can do it yourself. Visit the VAERS website at www.vaers.hhs.gov or call 1-800-822-7967. VAERS is only for reporting reactions, and VAERS staff do not give medical advice. 6. How can I learn more?  Ask your health care provider.  Call your local or state health department.  Contact the Centers for Disease Control and Prevention (CDC): ? Call 1-800-232-4636 (1-800-CDC-INFO) or ? Visit CDC's website at www.cdc.gov/vaccines Vaccine Information Statement PPSV23 Vaccine (09/25/2018) This information is not intended to replace advice given to you by your health care provider. Make sure you discuss   any questions you have with your health care provider. Document Revised: 07/16/2020  Document Reviewed: 07/16/2020 Elsevier Patient Education  2021 [employer]vier Inc.   

## 2021-03-10 NOTE — Addendum Note (Signed)
Addended by: Ronita Hipps on: 03/10/2021 10:18 AM   Modules accepted: Orders

## 2021-03-10 NOTE — Progress Notes (Signed)
Subjective:  Patient ID: Valerie Marshall, female    DOB: Aug 23, 1941  Age: 80 y.o. MRN: [id number]  CC:  Chief Complaint  Patient presents with  . Follow-up    Doing okay, no concerns, pills did not help, pneumonia booster  . Anxiety  . Other    Vitamin D deficiency, health maintenance      HPI  This patient arrives today for the above.  Anxiety: At last office visit we had started her on hydroxyzine that she take at night to help her sleep for anxiety.  Tells me she took 1 dose which did help her sleep, but when she took subsequent doses she did not see any improvement in her sleeping so she stopped taking medication.  She tells me she is been taking her clorazepate twice a day and this seems to be keeping her anxiety and check.  She does continue to watch the news which does seem to be a trigger for her.  Vitamin D deficiency: She continues on her vitamin D3 supplement.  She is due to have serum levels checked today.  Health maintenance: She is due for PPSV23 vaccine today so we will have this administered today.  Past Medical History:  Diagnosis Date  . Anxiety   . Hemorrhoids   . Hernia of abdominal cavity   . Hyperlipidemia   . Hypertension   . Irritable bowel syndrome   . Osteopenia   . Vitamin D deficiency disease 10/16/2019      Family History  Problem Relation Age of Onset  . Breast cancer Sister   . Diabetes Sister   . Colon cancer Neg Hx   . Colon polyps Neg Hx   . Kidney disease Neg Hx   . Esophageal cancer Neg Hx   . Gallbladder disease Neg Hx     Social History   Social History Narrative   Divorced.Lives alone with dog.Retired.Previously Furniture conservator/restorer.   Social History   Tobacco Use  . Smoking status: Current Every Day Smoker    Packs/day: 0.50    Years: 45.00    Pack years: 22.50    Types: Cigarettes  . Smokeless tobacco: Never Used  Substance Use Topics  . Alcohol use: No    Alcohol/week: 0.0 standard drinks     Current Meds   Medication Sig  . amLODipine (NORVASC) 5 MG tablet TAKE 1 TABLET BY MOUTH ONCE A DAY.  Marland Kitchen Cholecalciferol (VITAMIN D-3) 125 MCG (5000 UT) TABS Take 1 tablet by mouth daily.  Marland Kitchen lisinopril (ZESTRIL) 20 MG tablet TAKE ONE TABLET BY MOUTH ONCE DAILY.  Marland Kitchen potassium chloride SA (KLOR-CON) 20 MEQ tablet TAKE (1) TABLET BY MOUTH TWICE DAILY.  . [DISCONTINUED] clorazepate (TRANXENE) 7.5 MG tablet TAKE ONE TABLET DAILY AS NEEDED.    ROS:  Review of Systems  Eyes: Negative for blurred vision.  Respiratory: Negative for shortness of breath.   Cardiovascular: Negative for chest pain.  Neurological: Negative for dizziness and headaches.     Objective:   Today's Vitals: BP 132/82   Pulse 96   Temp 97.7 F (36.5 C) (Temporal)   Ht 5' 1.5" (1.562 m)   Wt 179 lb 9.6 oz (81.5 kg)   SpO2 99%   BMI 33.39 kg/m  Vitals with BMI 03/10/2021 01/27/2021 10/26/2020  Height 5' 1.5" 5' 1.5" 5' 1.5"  Weight 179 lbs 10 oz 176 lbs 10 oz 180 lbs 6 oz  BMI 33.39 58.52 77.82  Systolic 423 536 144  Diastolic 82  78 84  Pulse 96 102 104     Physical Exam Vitals reviewed.  Constitutional:      General: She is not in acute distress.    Appearance: Normal appearance.  HENT:     Head: Normocephalic and atraumatic.  Neck:     Vascular: No carotid bruit.  Cardiovascular:     Rate and Rhythm: Normal rate and regular rhythm.     Pulses: Normal pulses.     Heart sounds: Normal heart sounds.  Pulmonary:     Effort: Pulmonary effort is normal.     Breath sounds: Normal breath sounds.  Skin:    General: Skin is warm and dry.  Neurological:     General: No focal deficit present.     Mental Status: She is alert and oriented to person, place, and time.  Psychiatric:        Mood and Affect: Mood normal.        Behavior: Behavior normal.        Judgment: Judgment normal.          Assessment and Plan   1. Anxiety   2. Vitamin D deficiency disease   3. Need for pneumococcal vaccine      Plan: 1.   We will increase her prescription for clorazepate so she can take 7.5 mg in the morning and in the evening as needed for anxiety.  We did discuss that I would not recommend further titrating this dose up based on her age and if her anxiety becomes difficult to control in the future we may need to consider psychiatry referral.  Tells me she understands. 2.  We will check serum vitamin D level and CMP. 3.  We will administer PPSV23 vaccine today.   Tests ordered Orders Placed This Encounter  Procedures  . CMP with eGFR([facility])  . Vitamin D, 25-hydroxy      Meds ordered this encounter  Medications  . clorazepate (TRANXENE) 7.5 MG tablet    Sig: Take 1 tablet (7.5 mg total) by mouth 2 (two) times daily as needed for anxiety.    Dispense:  60 tablet    Refill:  0    Order Specific Question:   Supervising Provider    Answer:   Doree Albee [[id number]]    Patient to follow-up in 3 months or sooner as needed.  Ailene Ards, NP

## 2021-03-11 LAB — COMPLETE METABOLIC PANEL WITH GFR
AG Ratio: 1.1 (calc) (ref 1.0–2.5)
ALT: 9 U/L (ref 6–29)
AST: 17 U/L (ref 10–35)
Albumin: 3.9 g/dL (ref 3.6–5.1)
Alkaline phosphatase (APISO): 103 U/L (ref 37–153)
BUN: 8 mg/dL (ref 7–25)
CO2: 30 mmol/L (ref 20–32)
Calcium: 10 mg/dL (ref 8.6–10.4)
Chloride: 104 mmol/L (ref 98–110)
Creat: 0.9 mg/dL (ref 0.60–0.93)
GFR, Est African American: 71 mL/min/{1.73_m2} (ref >=60)
GFR, Est Non African American: 61 mL/min/{1.73_m2} (ref >=60)
Globulin: 3.4 g/dL (calc) (ref 1.9–3.7)
Glucose, Bld: 91 mg/dL (ref 65–139)
Potassium: 5 mmol/L (ref 3.5–5.3)
Sodium: 140 mmol/L (ref 135–146)
Total Bilirubin: 1.1 mg/dL (ref 0.2–1.2)
Total Protein: 7.3 g/dL (ref 6.1–8.1)

## 2021-03-11 LAB — VITAMIN D 25 HYDROXY (VIT D DEFICIENCY, FRACTURES): Vit D, 25-Hydroxy: 63 ng/mL (ref 30–100)

## 2021-04-17 ENCOUNTER — Other Ambulatory Visit (INDEPENDENT_AMBULATORY_CARE_PROVIDER_SITE_OTHER): Payer: Self-pay | Admitting: Internal Medicine

## 2021-05-03 ENCOUNTER — Telehealth (INDEPENDENT_AMBULATORY_CARE_PROVIDER_SITE_OTHER): Payer: Self-pay | Admitting: Nurse Practitioner

## 2021-05-03 NOTE — Telephone Encounter (Signed)
Tried calling to  schedule Medicare Annual Wellness Visit (AWV) either virtually or in office.  No answer   awvi 11/27/09 per [facility]  please schedule at anytime with [facility] health coach  This should be a 45 minute visit.

## 2021-05-10 ENCOUNTER — Other Ambulatory Visit (INDEPENDENT_AMBULATORY_CARE_PROVIDER_SITE_OTHER): Payer: Self-pay | Admitting: Nurse Practitioner

## 2021-05-10 DIAGNOSIS — F419 Anxiety disorder, unspecified: Secondary | ICD-10-CM

## 2021-06-01 ENCOUNTER — Other Ambulatory Visit (INDEPENDENT_AMBULATORY_CARE_PROVIDER_SITE_OTHER): Payer: Self-pay | Admitting: Internal Medicine

## 2021-06-09 ENCOUNTER — Ambulatory Visit (INDEPENDENT_AMBULATORY_CARE_PROVIDER_SITE_OTHER): Payer: Medicare Other | Admitting: Nurse Practitioner

## 2021-06-22 ENCOUNTER — Encounter (INDEPENDENT_AMBULATORY_CARE_PROVIDER_SITE_OTHER): Payer: Self-pay | Admitting: Nurse Practitioner

## 2021-06-22 ENCOUNTER — Ambulatory Visit (INDEPENDENT_AMBULATORY_CARE_PROVIDER_SITE_OTHER): Payer: Medicare Other | Admitting: Nurse Practitioner

## 2021-06-22 ENCOUNTER — Other Ambulatory Visit: Payer: Self-pay

## 2021-06-22 VITALS — BP 128/68 | HR 97 | Temp 97.5°F | Ht 61.5 in | Wt 174.6 lb

## 2021-06-22 DIAGNOSIS — F419 Anxiety disorder, unspecified: Secondary | ICD-10-CM

## 2021-06-22 DIAGNOSIS — I10 Essential (primary) hypertension: Secondary | ICD-10-CM | POA: Diagnosis not present

## 2021-06-22 NOTE — Progress Notes (Signed)
Subjective:  Patient ID: Valerie Marshall, female    DOB: 1941/06/16  Age: 80 y.o. MRN: [id number]  CC:  Chief Complaint  Patient presents with   Follow-up    Doing good, no concerns   Anxiety   Hypertension      HPI  This patient arrives today for the above.  Anxiety: Clorazepate was increased at last office visit.  She tells me her anxiety is much better controlled with this increase.  She also tells me that she has stopped watching the news as often as she was which was a trigger for her anxiety.  Hypertension: She continues on her lisinopril and amlodipine and is tolerating them well.  Past Medical History:  Diagnosis Date   Anxiety    Hemorrhoids    Hernia of abdominal cavity    Hyperlipidemia    Hypertension    Irritable bowel syndrome    Osteopenia    Vitamin D deficiency disease 10/16/2019      Family History  Problem Relation Age of Onset   Breast cancer Sister    Diabetes Sister    Colon cancer Neg Hx    Colon polyps Neg Hx    Kidney disease Neg Hx    Esophageal cancer Neg Hx    Gallbladder disease Neg Hx     Social History   Social History Narrative   Divorced.Lives alone with dog.Retired.Previously Chartered certified accountant.   Social History   Tobacco Use   Smoking status: Every Day    Packs/day: 0.50    Years: 45.00    Pack years: 22.50    Types: Cigarettes   Smokeless tobacco: Never  Substance Use Topics   Alcohol use: No    Alcohol/week: 0.0 standard drinks     Current Meds  Medication Sig   amLODipine (NORVASC) 5 MG tablet TAKE 1 TABLET BY MOUTH ONCE A DAY.   Cholecalciferol (VITAMIN D-3) 125 MCG (5000 UT) TABS Take 2 tablets by mouth daily.   clorazepate (TRANXENE) 7.5 MG tablet Take 1 tablet (7.5 mg total) by mouth 2 (two) times daily as needed.   lisinopril (ZESTRIL) 20 MG tablet TAKE ONE TABLET BY MOUTH ONCE DAILY.   potassium chloride SA (KLOR-CON) 20 MEQ tablet TAKE (1) TABLET BY MOUTH TWICE DAILY.    ROS:  Review of Systems   Constitutional:  Negative for fever, malaise/fatigue and weight loss.  Eyes:  Negative for blurred vision.  Respiratory:  Negative for shortness of breath.   Cardiovascular:  Negative for chest pain.  Gastrointestinal:  Negative for abdominal pain and blood in stool.  Neurological:  Negative for dizziness, loss of consciousness and headaches.  Psychiatric/Behavioral:  Negative for suicidal ideas.     Objective:   Today's Vitals: BP 128/68   Pulse 97   Temp (!) 97.5 F (36.4 C) (Temporal)   Ht 5' 1.5" (1.562 m)   Wt 174 lb 9.6 oz (79.2 kg)   SpO2 92%   BMI 32.46 kg/m  Vitals with BMI 06/22/2021 03/10/2021 01/27/2021  Height 5' 1.5" 5' 1.5" 5' 1.5"  Weight 174 lbs 10 oz 179 lbs 10 oz 176 lbs 10 oz  BMI 32.46 33.39 32.83  Systolic 128 132 834  Diastolic 68 82 78  Pulse 97 96 102     Physical Exam Vitals reviewed.  Constitutional:      General: She is not in acute distress.    Appearance: Normal appearance.  HENT:     Head: Normocephalic and atraumatic.  Neck:  Vascular: No carotid bruit.  Cardiovascular:     Rate and Rhythm: Normal rate and regular rhythm.     Pulses: Normal pulses.     Heart sounds: Normal heart sounds.  Pulmonary:     Effort: Pulmonary effort is normal.     Breath sounds: Normal breath sounds.  Skin:    General: Skin is warm and dry.  Neurological:     General: No focal deficit present.     Mental Status: She is alert and oriented to person, place, and time.  Psychiatric:        Mood and Affect: Mood normal.        Behavior: Behavior normal.        Judgment: Judgment normal.         Assessment and Plan   1. Essential hypertension   2. Anxiety      Plan: 1.  She will continue on her antihypertensives as currently prescribed. 2.  Anxiety much improved she will continue take this medication as prescribed.  She does not feel the need to see psychiatry at this time so we will hold off on referral but may consider this in the future if  needed.   Tests ordered No orders of the defined types were placed in this encounter.     No orders of the defined types were placed in this encounter.   Patient to follow-up in 3 months or sooner as needed.  Elenore Paddy, NP

## 2021-06-30 ENCOUNTER — Other Ambulatory Visit (INDEPENDENT_AMBULATORY_CARE_PROVIDER_SITE_OTHER): Payer: Self-pay | Admitting: Nurse Practitioner

## 2021-07-19 ENCOUNTER — Other Ambulatory Visit (INDEPENDENT_AMBULATORY_CARE_PROVIDER_SITE_OTHER): Payer: Self-pay | Admitting: Nurse Practitioner

## 2021-07-21 ENCOUNTER — Telehealth (INDEPENDENT_AMBULATORY_CARE_PROVIDER_SITE_OTHER): Payer: Self-pay | Admitting: Nurse Practitioner

## 2021-07-21 DIAGNOSIS — I10 Essential (primary) hypertension: Secondary | ICD-10-CM

## 2021-07-21 MED ORDER — LISINOPRIL 20 MG PO TABS: 20.0000 mg | ORAL_TABLET | Freq: Every day | ORAL | 0 refills | Status: AC

## 2021-07-21 NOTE — Telephone Encounter (Signed)
Refill approved and sent to pharmacy. 

## 2021-09-22 ENCOUNTER — Ambulatory Visit (INDEPENDENT_AMBULATORY_CARE_PROVIDER_SITE_OTHER): Payer: Medicare Other | Admitting: Internal Medicine

## 2021-12-21 ENCOUNTER — Encounter (HOSPITAL_COMMUNITY): Payer: Self-pay

## 2021-12-21 ENCOUNTER — Emergency Department (HOSPITAL_COMMUNITY): Payer: Medicare Other

## 2021-12-21 ENCOUNTER — Inpatient Hospital Stay (HOSPITAL_COMMUNITY)
Admission: EM | Admit: 2021-12-21 | Discharge: 2021-12-24 | DRG: 682 | Disposition: A | Discharge status: Skilled Nursing Facility | Payer: Medicare Other | Attending: Family Medicine | Admitting: Family Medicine

## 2021-12-21 ENCOUNTER — Other Ambulatory Visit: Payer: Self-pay

## 2021-12-21 DIAGNOSIS — S7002XA Contusion of left hip, initial encounter: Secondary | ICD-10-CM | POA: Diagnosis present

## 2021-12-21 DIAGNOSIS — Z79899 Other long term (current) drug therapy: Secondary | ICD-10-CM

## 2021-12-21 DIAGNOSIS — S8410XA Injury of peroneal nerve at lower leg level, unspecified leg, initial encounter: Secondary | ICD-10-CM | POA: Diagnosis present

## 2021-12-21 DIAGNOSIS — R531 Weakness: Secondary | ICD-10-CM | POA: Diagnosis present

## 2021-12-21 DIAGNOSIS — Y92022 Bathroom in mobile home as the place of occurrence of the external cause: Secondary | ICD-10-CM | POA: Diagnosis not present

## 2021-12-21 DIAGNOSIS — S7012XA Contusion of left thigh, initial encounter: Secondary | ICD-10-CM | POA: Diagnosis present

## 2021-12-21 DIAGNOSIS — Z833 Family history of diabetes mellitus: Secondary | ICD-10-CM

## 2021-12-21 DIAGNOSIS — U071 COVID-19: Secondary | ICD-10-CM | POA: Diagnosis present

## 2021-12-21 DIAGNOSIS — S70211A Abrasion, right hip, initial encounter: Secondary | ICD-10-CM | POA: Diagnosis present

## 2021-12-21 DIAGNOSIS — F419 Anxiety disorder, unspecified: Secondary | ICD-10-CM | POA: Diagnosis present

## 2021-12-21 DIAGNOSIS — T796XXA Traumatic ischemia of muscle, initial encounter: Secondary | ICD-10-CM | POA: Diagnosis present

## 2021-12-21 DIAGNOSIS — E559 Vitamin D deficiency, unspecified: Secondary | ICD-10-CM | POA: Diagnosis present

## 2021-12-21 DIAGNOSIS — S7001XA Contusion of right hip, initial encounter: Secondary | ICD-10-CM | POA: Diagnosis present

## 2021-12-21 DIAGNOSIS — Z885 Allergy status to narcotic agent status: Secondary | ICD-10-CM | POA: Diagnosis not present

## 2021-12-21 DIAGNOSIS — S70212A Abrasion, left hip, initial encounter: Secondary | ICD-10-CM | POA: Diagnosis present

## 2021-12-21 DIAGNOSIS — Z803 Family history of malignant neoplasm of breast: Secondary | ICD-10-CM

## 2021-12-21 DIAGNOSIS — E785 Hyperlipidemia, unspecified: Secondary | ICD-10-CM | POA: Diagnosis present

## 2021-12-21 DIAGNOSIS — I10 Essential (primary) hypertension: Secondary | ICD-10-CM | POA: Diagnosis present

## 2021-12-21 DIAGNOSIS — N179 Acute kidney failure, unspecified: Principal | ICD-10-CM

## 2021-12-21 DIAGNOSIS — F17210 Nicotine dependence, cigarettes, uncomplicated: Secondary | ICD-10-CM | POA: Diagnosis present

## 2021-12-21 DIAGNOSIS — W172XXA Fall into hole, initial encounter: Secondary | ICD-10-CM | POA: Diagnosis present

## 2021-12-21 DIAGNOSIS — M8580 Other specified disorders of bone density and structure, unspecified site: Secondary | ICD-10-CM | POA: Diagnosis present

## 2021-12-21 DIAGNOSIS — M6282 Rhabdomyolysis: Secondary | ICD-10-CM | POA: Diagnosis present

## 2021-12-21 DIAGNOSIS — M21372 Foot drop, left foot: Secondary | ICD-10-CM | POA: Diagnosis present

## 2021-12-21 LAB — URINALYSIS, ROUTINE W REFLEX MICROSCOPIC
Bilirubin Urine: NEGATIVE
Glucose, UA: NEGATIVE mg/dL
Hgb urine dipstick: NEGATIVE
Ketones, ur: NEGATIVE mg/dL
Leukocytes,Ua: NEGATIVE
Nitrite: NEGATIVE
Protein, ur: NEGATIVE mg/dL
Specific Gravity, Urine: 1.018 (ref 1.005–1.030)
pH: 5 (ref 5.0–8.0)

## 2021-12-21 LAB — COMPREHENSIVE METABOLIC PANEL
ALT: 125 U/L — ABNORMAL HIGH (ref 0–44)
AST: 430 U/L — ABNORMAL HIGH (ref 15–41)
Albumin: 3.1 g/dL — ABNORMAL LOW (ref 3.5–5.0)
Alkaline Phosphatase: 61 U/L (ref 38–126)
Anion gap: 9 (ref 5–15)
BUN: 69 mg/dL — ABNORMAL HIGH (ref 8–23)
CO2: 28 mmol/L (ref 22–32)
Calcium: 8.9 mg/dL (ref 8.9–10.3)
Chloride: 93 mmol/L — ABNORMAL LOW (ref 98–111)
Creatinine, Ser: 1.66 mg/dL — ABNORMAL HIGH (ref 0.44–1.00)
GFR, Estimated: 31 mL/min — ABNORMAL LOW (ref >60)
Glucose, Bld: 104 mg/dL — ABNORMAL HIGH (ref 70–99)
Potassium: 3.4 mmol/L — ABNORMAL LOW (ref 3.5–5.1)
Sodium: 130 mmol/L — ABNORMAL LOW (ref 135–145)
Total Bilirubin: 1.1 mg/dL (ref 0.3–1.2)
Total Protein: 6.7 g/dL (ref 6.5–8.1)

## 2021-12-21 LAB — CBC
HCT: 45.3 % (ref 36.0–46.0)
Hemoglobin: 14.9 g/dL (ref 12.0–15.0)
MCH: 29.2 pg (ref 26.0–34.0)
MCHC: 32.9 g/dL (ref 30.0–36.0)
MCV: 88.6 fL (ref 80.0–100.0)
Platelets: 179 10*3/uL (ref 150–400)
RBC: 5.11 MIL/uL (ref 3.87–5.11)
RDW: 13.1 % (ref 11.5–15.5)
WBC: 8.4 10*3/uL (ref 4.0–10.5)
nRBC: 0 % (ref 0.0–0.2)

## 2021-12-21 LAB — CK
Total CK: 6006 U/L — ABNORMAL HIGH (ref 38–234)
Total CK: 4743 U/L — ABNORMAL HIGH (ref 38–234)

## 2021-12-21 LAB — RESP PANEL BY RT-PCR (FLU A&B, COVID) ARPGX2
Influenza A by PCR: NEGATIVE
Influenza B by PCR: NEGATIVE
SARS Coronavirus 2 by RT PCR: POSITIVE — AB

## 2021-12-21 MED ORDER — SODIUM CHLORIDE 0.9 % IV SOLN: INTRAVENOUS | Status: DC

## 2021-12-21 MED ORDER — SIMVASTATIN 20 MG PO TABS
10.0000 mg | ORAL_TABLET | Freq: Every day | ORAL | Status: DC
  Administered 2021-12-21: 17:00:00 10 mg via ORAL
  Filled 2021-12-21: qty 1

## 2021-12-21 MED ORDER — SODIUM CHLORIDE 0.9 % IV BOLUS
1000.0000 mL | Freq: Once | INTRAVENOUS | Status: AC
  Administered 2021-12-21: 14:00:00 1000 mL via INTRAVENOUS

## 2021-12-21 MED ORDER — ACETAMINOPHEN 325 MG PO TABS
650.0000 mg | ORAL_TABLET | Freq: Four times a day (QID) | ORAL | Status: DC | PRN
  Administered 2021-12-24: 650 mg via ORAL
  Filled 2021-12-21: qty 2

## 2021-12-21 MED ORDER — VITAMIN D 25 MCG (1000 UNIT) PO TABS
5000.0000 [IU] | ORAL_TABLET | Freq: Every day | ORAL | Status: DC
  Administered 2021-12-21 – 2021-12-24 (×4): 5000 [IU] via ORAL
  Filled 2021-12-21 (×4): qty 5

## 2021-12-21 MED ORDER — AMLODIPINE BESYLATE 5 MG PO TABS
5.0000 mg | ORAL_TABLET | Freq: Every day | ORAL | Status: DC
  Administered 2021-12-21 – 2021-12-24 (×3): 5 mg via ORAL
  Filled 2021-12-21 (×4): qty 1

## 2021-12-21 MED ORDER — NYSTATIN 100000 UNIT/ML MT SUSP
5.0000 mL | Freq: Once | OROMUCOSAL | Status: AC
Start: 2021-12-21 — End: 2021-12-21
  Administered 2021-12-21: 12:00:00 500000 [IU] via ORAL
  Filled 2021-12-21: qty 5

## 2021-12-21 MED ORDER — ACETAMINOPHEN 650 MG RE SUPP: 650.0000 mg | Freq: Four times a day (QID) | RECTAL | Status: DC | PRN

## 2021-12-21 MED ORDER — SODIUM CHLORIDE 0.9 % IV BOLUS
1000.0000 mL | Freq: Once | INTRAVENOUS | Status: AC
  Administered 2021-12-21: 12:00:00 1000 mL via INTRAVENOUS

## 2021-12-21 MED ORDER — SODIUM CHLORIDE 0.9 % IV BOLUS
250.0000 mL | Freq: Once | INTRAVENOUS | Status: AC
  Administered 2021-12-21: 11:00:00 250 mL via INTRAVENOUS

## 2021-12-21 NOTE — H&P (Signed)
Valerie Marshall is an 81 y.o. female.   Chief Complaint: Larey Seat through the floor HPI: 81 year old female who lives alone but her son lives next door fell through her floor Friday evening and was stuck on the floor at her waist for approximately 24 hours.  When she fell her son had just checked on her for the night.  He went to work the next day tried calling her all the next day she did not answer when he got back from work he went to check on her and she was found stuck in the floor.  He called 911 and EMS came and helped her get unstuck and she refused to come to the emergency room at that time.  Since then she has been weak she has noticed her left ankle is not working right.  She has been very sore.  Her son finally made her come to the emergency room today 4 days later.  Patient found to have acute kidney injury with CK level over 6000.  Also has a left foot drop.  Orthopedic surgery has been consulted recommending splint to left foot.  No fevers no nausea vomiting diarrhea.  No abdominal pain.  No swelling in her legs.  Past Medical History:  Diagnosis Date   Anxiety    Hemorrhoids    Hernia of abdominal cavity    Hyperlipidemia    Hypertension    Irritable bowel syndrome    Osteopenia    Vitamin D deficiency disease 10/16/2019    Past Surgical History:  Procedure Laterality Date   EYE SURGERY Bilateral    lasix    NECK SURGERY      Family History  Problem Relation Age of Onset   Breast cancer Sister    Diabetes Sister    Colon cancer Neg Hx    Colon polyps Neg Hx    Kidney disease Neg Hx    Esophageal cancer Neg Hx    Gallbladder disease Neg Hx    Social History:  reports that she has been smoking cigarettes. She has a 22.50 pack-year smoking history. She has never used smokeless tobacco. She reports that she does not drink alcohol and does not use drugs.  Allergies:  Allergies  Allergen Reactions   Bee Venom Swelling   Codeine Other (See Comments)    Nervousness,  Hallucinations    (Not in a hospital admission)   Results for orders placed or performed during the hospital encounter of 12/21/21 (from the past 48 hour(s))  Urinalysis, Routine w reflex microscopic     Status: None   Collection Time: 12/21/21 10:17 AM  Result Value Ref Range   Color, Urine YELLOW YELLOW   APPearance CLEAR CLEAR   Specific Gravity, Urine 1.018 1.005 - 1.030   pH 5.0 5.0 - 8.0   Glucose, UA NEGATIVE NEGATIVE mg/dL   Hgb urine dipstick NEGATIVE NEGATIVE   Bilirubin Urine NEGATIVE NEGATIVE   Ketones, ur NEGATIVE NEGATIVE mg/dL   Protein, ur NEGATIVE NEGATIVE mg/dL   Nitrite NEGATIVE NEGATIVE   Leukocytes,Ua NEGATIVE NEGATIVE    Comment: Performed at [facility], [address], [place], [place] [address]  CBC     Status: None   Collection Time: 12/21/21 11:32 AM  Result Value Ref Range   WBC 8.4 4.0 - 10.5 K/uL   RBC 5.11 3.87 - 5.11 MIL/uL   Hemoglobin 14.9 12.0 - 15.0 g/dL   HCT 63.7 85.8 - 85.0 %   MCV 88.6 80.0 - 100.0 fL  MCH 29.2 26.0 - 34.0 pg   MCHC 32.9 30.0 - 36.0 g/dL   RDW 82.9 56.2 - 13.0 %   Platelets 179 150 - 400 K/uL   nRBC 0.0 0.0 - 0.2 %    Comment: Performed at [facility], [address], [place], [place] [address]  Comprehensive metabolic panel     Status: Abnormal   Collection Time: 12/21/21 11:32 AM  Result Value Ref Range   Sodium 130 (L) 135 - 145 mmol/L   Potassium 3.4 (L) 3.5 - 5.1 mmol/L   Chloride 93 (L) 98 - 111 mmol/L   CO2 28 22 - 32 mmol/L   Glucose, Bld 104 (H) 70 - 99 mg/dL    Comment: Glucose reference range applies only to samples taken after fasting for at least 8 hours.   BUN 69 (H) 8 - 23 mg/dL   Creatinine, Ser 4.69 (H) 0.44 - 1.00 mg/dL   Calcium 8.9 8.9 - 62.9 mg/dL   Total Protein 6.7 6.5 - 8.1 g/dL   Albumin 3.1 (L) 3.5 - 5.0 g/dL   AST 528 (H) 15 - 41 U/L   ALT 125 (H) 0 - 44 U/L   Alkaline Phosphatase 61 38 - 126 U/L   Total Bilirubin 1.1 0.3 - 1.2 mg/dL   GFR, Estimated 31 (L) >60 mL/min     Comment: (NOTE) Calculated using the CKD-EPI Creatinine Equation (2021)    Anion gap 9 5 - 15    Comment: Performed at [facility], [address], [place], [place] [address]  CK     Status: Abnormal   Collection Time: 12/21/21 11:32 AM  Result Value Ref Range   Total CK 6,006 (H) 38 - 234 U/L    Comment: RESULTS CONFIRMED BY MANUAL DILUTION Performed at [facility], [address] [place], [place] [address]    DG Tibia/Fibula Left  Result Date: 12/21/2021 CLINICAL DATA:  Footdrop secondary to fall. Left leg wound through the floor 5 days ago and patient was stuck there for 24 hours. Increased weakness and pain and numbness. Wound to left lateral distal femur. No wound to left knee or left tibia/fibula. EXAM: LEFT KNEE - COMPLETE 4+ VIEW; LEFT TIBIA AND FIBULA - 2 VIEW COMPARISON:  Left knee radiographs 08/21/2012 FINDINGS: There is diffuse decreased bone mineralization. Moderate to severe medial compartment joint space narrowing and peripheral osteophytosis. Mild peripheral lateral compartment degenerative osteophytosis. Severe patellofemoral joint space narrowing and peripheral osteophytosis. Large posterior femoral condyle degenerative osteophytes. No acute fracture is seen. Within the limitations of diffuse decreased bone mineralization no acute fracture is seen. A small sclerotic probable bone island within the proximal tibial metaphysis is unchanged. No definite acute fracture is seen within the tibia or fibula. Normal alignment at the ankle. There is mild chronic spurring at the Achilles insertion on the calcaneus. IMPRESSION:: IMPRESSION: 1. No acute fracture is seen within the left knee, tibia, or fibula. 2. 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 Electronically Signed   By: Neita Garnet M.D.   On: 12/21/2021 11:27   DG Chest Portable 1 View  Result Date: 12/21/2021 CLINICAL DATA:  Low O2 sats EXAM: PORTABLE CHEST 1 VIEW COMPARISON:  Chest  radiograph 11/19/2017 FINDINGS: The cardiomediastinal silhouette is within normal limits. There is calcified atherosclerotic plaque of the aortic arch. There is no focal consolidation or pulmonary edema. There is no pleural effusion or pneumothorax. There is no acute osseous abnormality. IMPRESSION: No radiographic evidence of acute cardiopulmonary process.  Electronically Signed   By: Lesia HausenPeter  Noone M.D.   On: 12/21/2021 12:03   DG Knee Complete 4 Views Left  Result Date: 12/21/2021 CLINICAL DATA:  Footdrop secondary to fall. Left leg wound through the floor 5 days ago and patient was stuck there for 24 hours. Increased weakness and pain and numbness. Wound to left lateral distal femur. No wound to left knee or left tibia/fibula. EXAM: LEFT KNEE - COMPLETE 4+ VIEW; LEFT TIBIA AND FIBULA - 2 VIEW COMPARISON:  Left knee radiographs 08/21/2012 FINDINGS: There is diffuse decreased bone mineralization. Moderate to severe medial compartment joint space narrowing and peripheral osteophytosis. Mild peripheral lateral compartment degenerative osteophytosis. Severe patellofemoral joint space narrowing and peripheral osteophytosis. Large posterior femoral condyle degenerative osteophytes. No acute fracture is seen. Within the limitations of diffuse decreased bone mineralization no acute fracture is seen. A small sclerotic probable bone island within the proximal tibial metaphysis is unchanged. No definite acute fracture is seen within the tibia or fibula. Normal alignment at the ankle. There is mild chronic spurring at the Achilles insertion on the calcaneus. IMPRESSION:: IMPRESSION: 1. No acute fracture is seen within the left knee, tibia, or fibula. 2. 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 Electronically Signed   By: Neita Garnetonald  Viola M.D.   On: 12/21/2021 11:27    Review of Systems  All other systems reviewed and are negative.  Blood pressure 110/61, pulse 87, temperature (!) 97 F  (36.1 C), temperature source Oral, resp. rate 19, height 5' 1.5" (1.562 m), weight 80 kg, SpO2 97 %. Physical Exam Vitals and nursing note reviewed.  Constitutional:      Appearance: Normal appearance. She is not ill-appearing.  HENT:     Head: Normocephalic and atraumatic.     Nose: No congestion.  Eyes:     Extraocular Movements: Extraocular movements intact.     Conjunctiva/sclera: Conjunctivae normal.     Pupils: Pupils are equal, round, and reactive to light.  Cardiovascular:     Rate and Rhythm: Normal rate and regular rhythm.     Pulses: Normal pulses.     Heart sounds: Normal heart sounds. No murmur heard.   No friction rub. No gallop.  Pulmonary:     Effort: Pulmonary effort is normal. No respiratory distress.     Breath sounds: Normal breath sounds. No stridor. No wheezing, rhonchi or rales.  Abdominal:     General: Abdomen is flat. Bowel sounds are normal. There is no distension.     Palpations: Abdomen is soft.     Tenderness: There is no abdominal tenderness. There is no guarding.  Musculoskeletal:        General: No swelling, deformity or signs of injury.     Cervical back: Normal range of motion and neck supple.     Right lower leg: No edema.     Left lower leg: No edema.  Skin:    General: Skin is warm and dry.     Coloration: Skin is not jaundiced.     Findings: Bruising and lesion present.  Neurological:     Mental Status: She is alert and oriented to person, place, and time.     Cranial Nerves: No cranial nerve deficit.     Motor: Weakness present.     Comments: Left foot drop  Psychiatric:        Mood and Affect: Mood normal.        Behavior: Behavior normal.  Thought Content: Thought content normal.        Judgment: Judgment normal.     Assessment/Plan 81 year old female fell from her floor for approximately 24 hours resulting in rhabdomyolysis with acute kidney injury and left foot drop  #1.  Rhabdomyolysis with acute kidney injury-CK level  over 6000.  Creatinine bumped up to 1.66.  Given 2 L of IV fluids in the ED.  Will serial CK levels every 8 hours and continue IV fluids.  Avoid nephrotoxic substances.  Extremities without any swelling and pulses intact without any signs of compartment syndrome.  #2.  Left foot drop-likely secondary to injury to peroneal nerve no back pain.  No other focal neurological deficits.  Orthopedic surgery consulted.  #3.  Hypertension-clarify and resume home meds  Further recommendations pending on overall hospital course.   Corianne Buccellato A, MD 12/21/2021, 2:46 PM

## 2021-12-21 NOTE — ED Notes (Signed)
Pt placed on cardiac monitor with BP to set cycle every 30 minutes. Continuous pulse oximeter applied.  

## 2021-12-21 NOTE — ED Provider Notes (Signed)
[facility] EMERGENCY DEPARTMENT Provider Note   CSN: [id number] Arrival date & time: 12/21/21  1003     History  Chief Complaint  Patient presents with   Leg Pain    Valerie Marshall is a 81 y.o. female who complains of decreased movement in her left foot.  The patient fell through the floor of her bathroom on Friday and was entrapped for 24 hours before her son found her and called EMS.  She declined transport to the emergency department on Saturday and has been in her bed since the incident.  Her son has been bringing a bedpan for the patient to use.  The patient has been unable to ambulate on her own since the fall.  The patient has a past medical history of hypertension and anxiety.  The patient's son states that she has been drinking fluids but has been eating very little over the past few days.       Home Medications Prior to Admission medications   Medication Sig Start Date End Date Taking? Authorizing Provider  amLODipine (NORVASC) 5 MG tablet TAKE 1 TABLET BY MOUTH ONCE A DAY. 06/30/21  Yes Elenore Paddy, NP  Cholecalciferol (VITAMIN D-3) 125 MCG (5000 UT) TABS Take 2 tablets by mouth daily.   Yes [provider]  clorazepate (TRANXENE) 7.5 MG tablet Take 1 tablet (7.5 mg total) by mouth 2 (two) times daily as needed. 05/11/21  Yes Gosrani, Nimish C, MD  lisinopril (ZESTRIL) 20 MG tablet Take 1 tablet (20 mg total) by mouth daily. 07/21/21  Yes Elenore Paddy, NP  potassium chloride SA (KLOR-CON) 20 MEQ tablet TAKE (1) TABLET BY MOUTH TWICE DAILY. 07/20/21  Yes Elenore Paddy, NP  simvastatin (ZOCOR) 10 MG tablet Take 10 mg by mouth daily. 09/28/21  Yes [provider]      Allergies    Bee venom and Codeine    Review of Systems   Review of Systems  Constitutional:  Positive for appetite change.  Respiratory:  Negative for shortness of breath.   Cardiovascular:  Negative for chest pain.  Gastrointestinal:  Positive for diarrhea. Negative for nausea and  vomiting.  Genitourinary: Negative.   Musculoskeletal:  Positive for gait problem.       Patient complains of inability to move left foot  Neurological:  Positive for weakness.   Physical Exam Updated Vital Signs BP 107/67    Pulse 96    Temp (!) 97 F (36.1 C) (Oral)    Resp 18    Ht 5' 1.5" (1.562 m)    Wt 80 kg    SpO2 98%    BMI 32.78 kg/m  Physical Exam HENT:     Head: Normocephalic.  Eyes:     Extraocular Movements: Extraocular movements intact.     Pupils: Pupils are equal, round, and reactive to light.  Cardiovascular:     Rate and Rhythm: Normal rate and regular rhythm.     Pulses: Normal pulses.  Pulmonary:     Effort: Pulmonary effort is normal.     Breath sounds: Normal breath sounds.  Abdominal:     Palpations: Abdomen is soft.  Musculoskeletal:     Cervical back: Normal range of motion.  Skin:    General: Skin is dry.     Findings: Abrasion and bruising present.     Comments: Abrasion and bruising noted on bilateral hips. Bruising noted on the lateral left thigh. Rash noted in pannicular fold with redness and skin  breakdown  Neurological:     Mental Status: She is alert.  Psychiatric:        Behavior: Behavior normal.    ED Results / Procedures / Treatments   Labs (all labs ordered are listed, but only abnormal results are displayed) Labs Reviewed  COMPREHENSIVE METABOLIC PANEL - Abnormal; Notable for the following components:      Result Value   Sodium 130 (*)    Potassium 3.4 (*)    Chloride 93 (*)    Glucose, Bld 104 (*)    BUN 69 (*)    Creatinine, Ser 1.66 (*)    Albumin 3.1 (*)    AST 430 (*)    ALT 125 (*)    GFR, Estimated 31 (*)    All other components within normal limits  CK - Abnormal; Notable for the following components:   Total CK 6,006 (*)    All other components within normal limits  CBC  URINALYSIS, ROUTINE W REFLEX MICROSCOPIC    EKG None  Radiology DG Tibia/Fibula Left  Result Date: 12/21/2021 CLINICAL DATA:   Footdrop secondary to fall. Left leg wound through the floor 5 days ago and patient was stuck there for 24 hours. Increased weakness and pain and numbness. Wound to left lateral distal femur. No wound to left knee or left tibia/fibula. EXAM: LEFT KNEE - COMPLETE 4+ VIEW; LEFT TIBIA AND FIBULA - 2 VIEW COMPARISON:  Left knee radiographs 08/21/2012 FINDINGS: There is diffuse decreased bone mineralization. Moderate to severe medial compartment joint space narrowing and peripheral osteophytosis. Mild peripheral lateral compartment degenerative osteophytosis. Severe patellofemoral joint space narrowing and peripheral osteophytosis. Large posterior femoral condyle degenerative osteophytes. No acute fracture is seen. Within the limitations of diffuse decreased bone mineralization no acute fracture is seen. A small sclerotic probable bone island within the proximal tibial metaphysis is unchanged. No definite acute fracture is seen within the tibia or fibula. Normal alignment at the ankle. There is mild chronic spurring at the Achilles insertion on the calcaneus. IMPRESSION:: IMPRESSION: 1. No acute fracture is seen within the left knee, tibia, or fibula. 2. 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 Electronically Signed   By: Neita Garnet M.D.   On: 12/21/2021 11:27   DG Chest Portable 1 View  Result Date: 12/21/2021 CLINICAL DATA:  Low O2 sats EXAM: PORTABLE CHEST 1 VIEW COMPARISON:  Chest radiograph 11/19/2017 FINDINGS: The cardiomediastinal silhouette is within normal limits. There is calcified atherosclerotic plaque of the aortic arch. There is no focal consolidation or pulmonary edema. There is no pleural effusion or pneumothorax. There is no acute osseous abnormality. IMPRESSION: No radiographic evidence of acute cardiopulmonary process. Electronically Signed   By: Lesia Hausen M.D.   On: 12/21/2021 12:03   DG Knee Complete 4 Views Left  Result Date: 12/21/2021 CLINICAL  DATA:  Footdrop secondary to fall. Left leg wound through the floor 5 days ago and patient was stuck there for 24 hours. Increased weakness and pain and numbness. Wound to left lateral distal femur. No wound to left knee or left tibia/fibula. EXAM: LEFT KNEE - COMPLETE 4+ VIEW; LEFT TIBIA AND FIBULA - 2 VIEW COMPARISON:  Left knee radiographs 08/21/2012 FINDINGS: There is diffuse decreased bone mineralization. Moderate to severe medial compartment joint space narrowing and peripheral osteophytosis. Mild peripheral lateral compartment degenerative osteophytosis. Severe patellofemoral joint space narrowing and peripheral osteophytosis. Large posterior femoral condyle degenerative osteophytes. No acute fracture is seen. Within the limitations of diffuse decreased bone  mineralization no acute fracture is seen. A small sclerotic probable bone island within the proximal tibial metaphysis is unchanged. No definite acute fracture is seen within the tibia or fibula. Normal alignment at the ankle. There is mild chronic spurring at the Achilles insertion on the calcaneus. IMPRESSION:: IMPRESSION: 1. No acute fracture is seen within the left knee, tibia, or fibula. 2. 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 Electronically Signed   By: Neita Garnetonald  Viola M.D.   On: 12/21/2021 11:27    Procedures .1-3 Lead EKG Interpretation Performed by: Darrick GrinderMcCauley, Lyriq Finerty B, PA Authorized by: Darrick GrinderMcCauley, Monterrio Gerst B, PA     Interpretation: normal     ECG rate:  88   ECG rate assessment: normal     Rhythm: sinus rhythm     Ectopy: none      Medications Ordered in ED Medications  sodium chloride 0.9 % bolus 1,000 mL (has no administration in time range)  sodium chloride 0.9 % bolus 250 mL (0 mLs Intravenous Stopped 12/21/21 1255)  nystatin (MYCOSTATIN) 100000 UNIT/ML suspension 500,000 Units (500,000 Units Oral Given 12/21/21 1137)  sodium chloride 0.9 % bolus 1,000 mL (0 mLs Intravenous Stopped 12/21/21  1255)    ED Course/ Medical Decision Making/ A&P                           Medical Decision Making Amount and/or Complexity of Data Reviewed Labs: ordered. Radiology: ordered.  Risk Prescription drug management. Decision regarding hospitalization.   This patient presents to the ED for concern of left leg weakness secondary to a fall. This involves an extensive number of treatment options, and is a complaint that carries with it a high risk of complications and morbidity.  The differential diagnosis includes peroneal nerve injury, fracture, compartment syndrome, and many others   Co morbidities that complicate the patient evaluation  None   Additional history obtained:  Additional history obtained from son    Lab Tests:  I Ordered, and personally interpreted labs.  The pertinent results include: CK  6006, sodium 130, potassium 3.4, BUN 69, creatinine 1.66, AST 430, ALT 125.   Imaging Studies ordered:  I ordered imaging studies including plain graphs of the left knee, left tibia and fibula, and chest I independently visualized and interpreted imaging which showed no acute injury, fracture, or dislocation in the left knee, tibia, or fibula.  Advanced arthritic changes noted in the left knee.  No acute process noted in the chest I agree with the radiologist interpretation   Cardiac Monitoring:  The patient was maintained on a cardiac monitor.  I personally viewed and interpreted the cardiac monitored which showed an underlying rhythm of: normal sinus rhythm   Medicines ordered and prescription drug management:  I ordered medication including nystatin powder for Candidiasis in the pannicular fold.  I ordered normal saline bolus for hypotension.  2 LPM of oxygen via nasal cannula was ordered due to SPO2 of 88% Reevaluation of the patient after these medicines showed that the patient improved I have reviewed the patients home medicines and have made adjustments as  needed     Critical Interventions:  None   Consultations Obtained:  I requested consultation with the hospitalist,  and discussed lab and imaging findings as well as pertinent plan - Dr.David recommends: Admission for medical management.  I also requested consultation with orthopedics in reference to the patient's foot drop. Dr.Harrison recommended a short posterior leg splint.  Reevaluation:  After the interventions noted above, I reevaluated the patient and found that they have :improved   Social Determinants of Health:  Patient's son unable to care for patient at this time. Home unsafe due to collapsed floor in bathroom   Dispostion:  After consideration of the diagnostic results and the patients response to treatment, I feel that the patent would benefit from admission to the hospitalist service for further medical management.      Final Clinical Impression(s) / ED Diagnoses Final diagnoses:  Acute kidney injury (HCC)  Foot drop, left foot  Traumatic rhabdomyolysis, initial encounter [facility](HCC)    Rx / DC Orders ED Discharge Orders     None         Darrick GrinderMcCauley, Riggins Cisek B, [place] 12/21/21 1450    Eber HongMiller, Brian, MD 01/01/22 1531

## 2021-12-21 NOTE — ED Triage Notes (Signed)
Pts leg went through the floor on Friday and pt was stuck there for 24 hours. Pt reports increased weakness pain  and numbness since. BS 108. Pt was able to use walker and crutch to ambulate.

## 2021-12-22 DIAGNOSIS — N179 Acute kidney failure, unspecified: Secondary | ICD-10-CM | POA: Diagnosis not present

## 2021-12-22 LAB — CBC
HCT: 39.7 % (ref 36.0–46.0)
Hemoglobin: 12.9 g/dL (ref 12.0–15.0)
MCH: 29.9 pg (ref 26.0–34.0)
MCHC: 32.5 g/dL (ref 30.0–36.0)
MCV: 91.9 fL (ref 80.0–100.0)
Platelets: 163 10*3/uL (ref 150–400)
RBC: 4.32 MIL/uL (ref 3.87–5.11)
RDW: 13.3 % (ref 11.5–15.5)
WBC: 4.7 10*3/uL (ref 4.0–10.5)
nRBC: 0 % (ref 0.0–0.2)

## 2021-12-22 LAB — PROCALCITONIN: Procalcitonin: 0.51 ng/mL

## 2021-12-22 LAB — BASIC METABOLIC PANEL
Anion gap: 7 (ref 5–15)
BUN: 42 mg/dL — ABNORMAL HIGH (ref 8–23)
CO2: 27 mmol/L (ref 22–32)
Calcium: 8.8 mg/dL — ABNORMAL LOW (ref 8.9–10.3)
Chloride: 104 mmol/L (ref 98–111)
Creatinine, Ser: 1.04 mg/dL — ABNORMAL HIGH (ref 0.44–1.00)
GFR, Estimated: 55 mL/min — ABNORMAL LOW (ref >60)
Glucose, Bld: 90 mg/dL (ref 70–99)
Potassium: 3.8 mmol/L (ref 3.5–5.1)
Sodium: 138 mmol/L (ref 135–145)

## 2021-12-22 LAB — C-REACTIVE PROTEIN: CRP: 1.9 mg/dL — ABNORMAL HIGH (ref <1.0)

## 2021-12-22 LAB — CK
Total CK: 2932 U/L — ABNORMAL HIGH (ref 38–234)
Total CK: 4036 U/L — ABNORMAL HIGH (ref 38–234)
Total CK: 4043 U/L — ABNORMAL HIGH (ref 38–234)
Total CK: 4058 U/L — ABNORMAL HIGH (ref 38–234)

## 2021-12-22 LAB — FERRITIN: Ferritin: 671 ng/mL — ABNORMAL HIGH (ref 11–307)

## 2021-12-22 LAB — FIBRINOGEN: Fibrinogen: 400 mg/dL (ref 210–475)

## 2021-12-22 LAB — D-DIMER, QUANTITATIVE: D-Dimer, Quant: 1.46 ug/mL-FEU — ABNORMAL HIGH (ref 0.00–0.50)

## 2021-12-22 MED ORDER — ASCORBIC ACID 500 MG PO TABS
500.0000 mg | ORAL_TABLET | Freq: Every day | ORAL | Status: DC
  Administered 2021-12-22 – 2021-12-24 (×3): 500 mg via ORAL
  Filled 2021-12-22 (×3): qty 1

## 2021-12-22 MED ORDER — NIRMATRELVIR/RITONAVIR (PAXLOVID) TABLET (RENAL DOSING)
2.0000 | ORAL_TABLET | Freq: Two times a day (BID) | ORAL | Status: DC
  Administered 2021-12-22 – 2021-12-24 (×5): 2 via ORAL
  Filled 2021-12-22: qty 20

## 2021-12-22 MED ORDER — SODIUM CHLORIDE 0.9 % IV BOLUS
1000.0000 mL | Freq: Once | INTRAVENOUS | Status: AC
  Administered 2021-12-22: 1000 mL via INTRAVENOUS

## 2021-12-22 MED ORDER — ENOXAPARIN SODIUM 40 MG/0.4ML IJ SOSY
40.0000 mg | PREFILLED_SYRINGE | INTRAMUSCULAR | Status: DC
  Administered 2021-12-22 – 2021-12-24 (×3): 40 mg via SUBCUTANEOUS
  Filled 2021-12-22 (×3): qty 0.4

## 2021-12-22 MED ORDER — ZINC SULFATE 220 (50 ZN) MG PO CAPS
220.0000 mg | ORAL_CAPSULE | Freq: Every day | ORAL | Status: DC
  Administered 2021-12-22 – 2021-12-24 (×3): 220 mg via ORAL
  Filled 2021-12-22 (×3): qty 1

## 2021-12-22 MED ORDER — NIRMATRELVIR/RITONAVIR (PAXLOVID)TABLET: 3.0000 | ORAL_TABLET | Freq: Two times a day (BID) | ORAL | Status: DC

## 2021-12-22 NOTE — Progress Notes (Signed)
Valerie Marshall is an 81 y.o. female.   Chief Complaint: Larey Seat through the floor HPI: 81 year old female who lives alone but her son lives next door fell through her floor Friday evening and was stuck on the floor at her waist for approximately 24 hours.  When she fell her son had just checked on her for the night.  He went to work the next day tried calling her all the next day she did not answer when he got back from work he went to check on her and she was found stuck in the floor.  He called 911 and EMS came and helped her get unstuck and she refused to come to the emergency room at that time.  Since then she has been weak she has noticed her left ankle is not working right.  She has been very sore.  Her son finally made her come to the emergency room today 4 days later.  Patient found to have acute kidney injury with CK level over 6000.  Also has a left foot drop.  Orthopedic surgery has been consulted recommending splint to left foot.  No fevers no nausea vomiting diarrhea.  No abdominal pain.  No swelling in her legs.  Subjective Feeling better.  Splint to her left foot is bothering her a lot.  Wants to go home.  Results for orders placed or performed during the hospital encounter of 12/21/21 (from the past 48 hour(s))  Urinalysis, Routine w reflex microscopic     Status: None   Collection Time: 12/21/21 10:17 AM  Result Value Ref Range   Color, Urine YELLOW YELLOW   APPearance CLEAR CLEAR   Specific Gravity, Urine 1.018 1.005 - 1.030   pH 5.0 5.0 - 8.0   Glucose, UA NEGATIVE NEGATIVE mg/dL   Hgb urine dipstick NEGATIVE NEGATIVE   Bilirubin Urine NEGATIVE NEGATIVE   Ketones, ur NEGATIVE NEGATIVE mg/dL   Protein, ur NEGATIVE NEGATIVE mg/dL   Nitrite NEGATIVE NEGATIVE   Leukocytes,Ua NEGATIVE NEGATIVE    Comment: Performed at [facility], [address], [place], [place] [address]  CBC     Status: None   Collection Time: 12/21/21 11:32 AM  Result Value Ref Range   WBC 8.4 4.0 - 10.5  K/uL   RBC 5.11 3.87 - 5.11 MIL/uL   Hemoglobin 14.9 12.0 - 15.0 g/dL   HCT 08.6 57.8 - 46.9 %   MCV 88.6 80.0 - 100.0 fL   MCH 29.2 26.0 - 34.0 pg   MCHC 32.9 30.0 - 36.0 g/dL   RDW 62.9 52.8 - 41.3 %   Platelets 179 150 - 400 K/uL   nRBC 0.0 0.0 - 0.2 %    Comment: Performed at [facility], [address] [place], [place] [address]  Comprehensive metabolic panel     Status: Abnormal   Collection Time: 12/21/21 11:32 AM  Result Value Ref Range   Sodium 130 (L) 135 - 145 mmol/L   Potassium 3.4 (L) 3.5 - 5.1 mmol/L   Chloride 93 (L) 98 - 111 mmol/L   CO2 28 22 - 32 mmol/L   Glucose, Bld 104 (H) 70 - 99 mg/dL    Comment: Glucose reference range applies only to samples taken after fasting for at least 8 hours.   BUN 69 (H) 8 - 23 mg/dL   Creatinine, Ser 0.27 (H) 0.44 - 1.00 mg/dL   Calcium 8.9 8.9 - 25.3 mg/dL   Total Protein 6.7 6.5 - 8.1 g/dL   Albumin 3.1 (  L) 3.5 - 5.0 g/dL   AST 528430 (H) 15 - 41 U/L   ALT 125 (H) 0 - 44 U/L   Alkaline Phosphatase 61 38 - 126 U/L   Total Bilirubin 1.1 0.3 - 1.2 mg/dL   GFR, Estimated 31 (L) >60 mL/min    Comment: (NOTE) Calculated using the CKD-EPI Creatinine Equation (2021)    Anion gap 9 5 - 15    Comment: Performed at [facility], [address] [place], [place] [address]  CK     Status: Abnormal   Collection Time: 12/21/21 11:32 AM  Result Value Ref Range   Total CK 6,006 (H) 38 - 234 U/L    Comment: RESULTS CONFIRMED BY MANUAL DILUTION Performed at [facility], [address] [place], [place] [address]   Resp Panel by RT-PCR (Flu A&B, Covid) Nasopharyngeal Swab     Status: Abnormal   Collection Time: 12/21/21  1:31 PM   Specimen: Nasopharyngeal Swab; Nasopharyngeal(NP) swabs in vial transport medium  Result Value Ref Range   SARS Coronavirus 2 by RT PCR POSITIVE (A) NEGATIVE    Comment: (NOTE) SARS-CoV-2 target nucleic acids are DETECTED.  The SARS-CoV-2 RNA is generally detectable in upper respiratory specimens during the  acute phase of infection. Positive results are indicative of the presence of the identified virus, but do not rule out bacterial infection or co-infection with other pathogens not detected by the test. Clinical correlation with patient history and other diagnostic information is necessary to determine patient infection status. The expected result is Negative.  Fact Sheet for Patients: BloggerCourse.comhttps://www.fda.gov/media/152166/download  Fact Sheet for Healthcare Providers: SeriousBroker.ithttps://www.fda.gov/media/152162/download  This test is not yet approved or cleared by the [place] FDA and  has been authorized for detection and/or diagnosis of SARS-CoV-2 by FDA under an Emergency Use Authorization (EUA).  This EUA will remain in effect (meaning this test can be used) for the duration of  the COVID-19 declaration under Section 564(b)(1) of the A ct, 21 U.S.C. section 360bbb-3(b)(1), unless the authorization is terminated or revoked sooner.     Influenza A by PCR NEGATIVE NEGATIVE   Influenza B by PCR NEGATIVE NEGATIVE    Comment: (NOTE) The Xpert Xpress SARS-CoV-2/FLU/RSV plus assay is intended as an aid in the diagnosis of influenza from Nasopharyngeal swab specimens and should not be used as a sole basis for treatment. Nasal washings and aspirates are unacceptable for Xpert Xpress SARS-CoV-2/FLU/RSV testing.  Fact Sheet for Patients: BloggerCourse.comhttps://www.fda.gov/media/152166/download  Fact Sheet for Healthcare Providers: SeriousBroker.ithttps://www.fda.gov/media/152162/download  This test is not yet approved or cleared by the [place] FDA and has been authorized for detection and/or diagnosis of SARS-CoV-2 by FDA under an Emergency Use Authorization (EUA). This EUA will remain in effect (meaning this test can be used) for the duration of the COVID-19 declaration under Section 564(b)(1) of the Act, 21 U.S.C. section 360bbb-3(b)(1), unless the authorization is terminated or revoked.  Performed at [facility], [address] [place], [place] [address]   CK     Status: Abnormal   Collection Time: 12/21/21  3:26 PM  Result Value Ref Range   Total CK 4,743 (H) 38 - 234 U/L    Comment: RESULTS CONFIRMED BY MANUAL DILUTION Performed at [facility], [address] [place], [place] [address]   CK     Status: Abnormal   Collection Time: 12/21/21 11:30 PM  Result Value Ref Range   Total CK 4,043 (H) 38 - 234 U/L    Comment: Performed at [facility], [address] [place], [place] [address]  Basic metabolic panel     Status: Abnormal   Collection Time: 12/22/21  5:55 AM  Result Value Ref Range   Sodium 138 135 - 145 mmol/L    Comment: DELTA CHECK NOTED   Potassium 3.8 3.5 - 5.1 mmol/L   Chloride 104 98 - 111 mmol/L   CO2 27 22 - 32 mmol/L   Glucose, Bld 90 70 - 99 mg/dL    Comment: Glucose reference range applies only to samples taken after fasting for at least 8 hours.   BUN 42 (H) 8 - 23 mg/dL   Creatinine, Ser 7.79 (H) 0.44 - 1.00 mg/dL   Calcium 8.8 (L) 8.9 - 10.3 mg/dL   GFR, Estimated 55 (L) >60 mL/min    Comment: (NOTE) Calculated using the CKD-EPI Creatinine Equation (2021)    Anion gap 7 5 - 15    Comment: Performed at [facility], [address], [place], [place] [address]  CBC     Status: None   Collection Time: 12/22/21  5:55 AM  Result Value Ref Range   WBC 4.7 4.0 - 10.5 K/uL   RBC 4.32 3.87 - 5.11 MIL/uL   Hemoglobin 12.9 12.0 - 15.0 g/dL   HCT 09.2 33.0 - 07.6 %   MCV 91.9 80.0 - 100.0 fL   MCH 29.9 26.0 - 34.0 pg   MCHC 32.5 30.0 - 36.0 g/dL   RDW 22.6 33.3 - 54.5 %   Platelets 163 150 - 400 K/uL   nRBC 0.0 0.0 - 0.2 %    Comment: Performed at [facility], [address], [place], [place] [address]  CK     Status: Abnormal   Collection Time: 12/22/21  5:55 AM  Result Value Ref Range   Total CK 4,058 (H) 38 - 234 U/L    Comment: Performed at [facility], [address], [place], [place] [address]  D-dimer, quantitative     Status: Abnormal   Collection Time:  12/22/21  8:08 AM  Result Value Ref Range   D-Dimer, Quant 1.46 (H) 0.00 - 0.50 ug/mL-FEU    Comment: (NOTE) At the manufacturer cut-off value of 0.5 g/mL FEU, this assay has a negative predictive value of 95-100%.This assay is intended for use in conjunction with a clinical pretest probability (PTP) assessment model to exclude pulmonary embolism (PE) and deep venous thrombosis (DVT) in outpatients suspected of PE or DVT. Results should be correlated with clinical presentation. Performed at [facility], [address], [place], [place] [address]   Ferritin     Status: Abnormal   Collection Time: 12/22/21  8:08 AM  Result Value Ref Range   Ferritin 671 (H) 11 - 307 ng/mL    Comment: Performed at [facility], [address], [place], [place] [address]  Procalcitonin     Status: None   Collection Time: 12/22/21  8:08 AM  Result Value Ref Range   Procalcitonin 0.51 ng/mL    Comment:        Interpretation: PCT > 0.5 ng/mL and <= 2 ng/mL: Systemic infection (sepsis) is possible, but other conditions are known to elevate PCT as well. (NOTE)       Sepsis PCT Algorithm           Lower Respiratory Tract  Infection PCT Algorithm    ----------------------------     ----------------------------         PCT < 0.25 ng/mL                PCT < 0.10 ng/mL          Strongly encourage             Strongly discourage   discontinuation of antibiotics    initiation of antibiotics    ----------------------------     -----------------------------       PCT 0.25 - 0.50 ng/mL            PCT 0.10 - 0.25 ng/mL               OR       >80% decrease in PCT            Discourage initiation of                                            antibiotics      Encourage discontinuation           of antibiotics    ----------------------------     -----------------------------         PCT >= 0.50 ng/mL              PCT 0.26 - 0.50 ng/mL                AND       <80% decrease in  PCT             Encourage initiation of                                             antibiotics       Encourage continuation           of antibiotics    ----------------------------     -----------------------------        PCT >= 0.50 ng/mL                  PCT > 0.50 ng/mL               AND         increase in PCT                  Strongly encourage                                      initiation of antibiotics    Strongly encourage escalation           of antibiotics                                     -----------------------------                                           PCT <= 0.25 ng/mL  OR                                        > 80% decrease in PCT                                      Discontinue / Do not initiate                                             antibiotics  Performed at [facility], [address] [place], [place] [address]   Fibrinogen     Status: None   Collection Time: 12/22/21  8:08 AM  Result Value Ref Range   Fibrinogen 400 210 - 475 mg/dL    Comment: (NOTE) Fibrinogen results may be underestimated in patients receiving thrombolytic therapy. Performed at [facility], [address] [place], [place] [address]    DG Tibia/Fibula Left  Result Date: 12/21/2021 CLINICAL DATA:  Footdrop secondary to fall. Left leg wound through the floor 5 days ago and patient was stuck there for 24 hours. Increased weakness and pain and numbness. Wound to left lateral distal femur. No wound to left knee or left tibia/fibula. EXAM: LEFT KNEE - COMPLETE 4+ VIEW; LEFT TIBIA AND FIBULA - 2 VIEW COMPARISON:  Left knee radiographs 08/21/2012 FINDINGS: There is diffuse decreased bone mineralization. Moderate to severe medial compartment joint space narrowing and peripheral osteophytosis. Mild peripheral lateral compartment degenerative osteophytosis. Severe patellofemoral joint space narrowing and peripheral osteophytosis. Large  posterior femoral condyle degenerative osteophytes. No acute fracture is seen. Within the limitations of diffuse decreased bone mineralization no acute fracture is seen. A small sclerotic probable bone island within the proximal tibial metaphysis is unchanged. No definite acute fracture is seen within the tibia or fibula. Normal alignment at the ankle. There is mild chronic spurring at the Achilles insertion on the calcaneus. IMPRESSION:: IMPRESSION: 1. No acute fracture is seen within the left knee, tibia, or fibula. 2. 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 Electronically Signed   By: Neita Garnetonald  Viola M.D.   On: 12/21/2021 11:27   DG Chest Portable 1 View  Result Date: 12/21/2021 CLINICAL DATA:  Low O2 sats EXAM: PORTABLE CHEST 1 VIEW COMPARISON:  Chest radiograph 11/19/2017 FINDINGS: The cardiomediastinal silhouette is within normal limits. There is calcified atherosclerotic plaque of the aortic arch. There is no focal consolidation or pulmonary edema. There is no pleural effusion or pneumothorax. There is no acute osseous abnormality. IMPRESSION: No radiographic evidence of acute cardiopulmonary process. Electronically Signed   By: Lesia HausenPeter  Noone M.D.   On: 12/21/2021 12:03   DG Knee Complete 4 Views Left  Result Date: 12/21/2021 CLINICAL DATA:  Footdrop secondary to fall. Left leg wound through the floor 5 days ago and patient was stuck there for 24 hours. Increased weakness and pain and numbness. Wound to left lateral distal femur. No wound to left knee or left tibia/fibula. EXAM: LEFT KNEE - COMPLETE 4+ VIEW; LEFT TIBIA AND FIBULA - 2 VIEW COMPARISON:  Left knee radiographs 08/21/2012 FINDINGS: There is diffuse decreased bone mineralization. Moderate to severe medial compartment joint space narrowing and peripheral osteophytosis. Mild peripheral lateral compartment degenerative osteophytosis. Severe  patellofemoral joint space narrowing and peripheral osteophytosis.  Large posterior femoral condyle degenerative osteophytes. No acute fracture is seen. Within the limitations of diffuse decreased bone mineralization no acute fracture is seen. A small sclerotic probable bone island within the proximal tibial metaphysis is unchanged. No definite acute fracture is seen within the tibia or fibula. Normal alignment at the ankle. There is mild chronic spurring at the Achilles insertion on the calcaneus. IMPRESSION:: IMPRESSION: 1. No acute fracture is seen within the left knee, tibia, or fibula. 2. 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 Electronically Signed   By: Neita Garnet M.D.   On: 12/21/2021 11:27     Blood pressure (!) 94/56, pulse 74, temperature 99.4 F (37.4 C), resp. rate 16, height 5' 1.5" (1.562 m), weight 80 kg, SpO2 95 %. Physical Exam Vitals and nursing note reviewed.  Constitutional:      Appearance: Normal appearance. She is not ill-appearing.  HENT:     Head: Normocephalic and atraumatic.     Nose: No congestion.  Eyes:     Extraocular Movements: Extraocular movements intact.     Conjunctiva/sclera: Conjunctivae normal.     Pupils: Pupils are equal, round, and reactive to light.  Cardiovascular:     Rate and Rhythm: Normal rate and regular rhythm.     Pulses: Normal pulses.     Heart sounds: Normal heart sounds. No murmur heard.   No friction rub. No gallop.  Pulmonary:     Effort: Pulmonary effort is normal. No respiratory distress.     Breath sounds: Normal breath sounds. No stridor. No wheezing, rhonchi or rales.  Abdominal:     General: Abdomen is flat. Bowel sounds are normal. There is no distension.     Palpations: Abdomen is soft.     Tenderness: There is no abdominal tenderness. There is no guarding.  Musculoskeletal:        General: No swelling, deformity or signs of injury.     Cervical back: Normal range of motion and neck supple.     Right lower leg: No edema.     Left lower leg: No  edema.  Skin:    General: Skin is warm and dry.     Coloration: Skin is not jaundiced.     Findings: Bruising and lesion present.  Neurological:     Mental Status: She is alert and oriented to person, place, and time.     Cranial Nerves: No cranial nerve deficit.     Motor: Weakness present.     Comments: Left foot drop  Psychiatric:        Mood and Affect: Mood normal.        Behavior: Behavior normal.        Thought Content: Thought content normal.        Judgment: Judgment normal.     Assessment/Plan 81 year old female fell from her floor for approximately 24 hours resulting in rhabdomyolysis with acute kidney injury and left foot drop  #1.  Rhabdomyolysis with acute kidney injury-CK level over 6000 on admission down to 4000.  Creatinine bumped up to 1.66 on admit now resolved.  Given 2 L of IV fluids in the ED.  Will serial CK levels every 8 hours and continue IV fluids.  Avoid nephrotoxic substances.  Extremities without any swelling and pulses intact without any signs of compartment syndrome.  #2.  Left foot drop-likely secondary to injury to peroneal nerve no back pain.  No other focal neurological  deficits.  Orthopedic surgery consulted.  #3.  Hypertension-clarify and resume home meds  Continue IV fluids and serial CK levels if they continue to trend down discharge tomorrow obtain physical therapy evaluation.  Check on living situation plan with son.  Further recommendations pending on overall hospital course.   Haydee Monica, MD 12/22/2021, 10:00 AM   Patient ID: [name], female   DOB: Oct 18, 1941, 81 y.o.   MRN: [id number]

## 2021-12-22 NOTE — TOC Initial Note (Signed)
Transition of Care [facility]) - Initial/Assessment Note    Patient Details  Name: Valerie Marshall MRN: [id number] Date of Birth: 05/25/1941  [facility] CM/SW Contact:    [name], LCSWA Phone Number: 12/22/2021, 2:04 PM  Clinical Narrative:                 CSW spoke with pts son to complete assessment. Pt lives alone and her son checks on her daily. Pt has been independent in completing her ADLs and provides her own transportation. Pt has not had HH services in the past. Pts son just bought her a cane to use as needs in the home when ambulating.   CSW spoke with pts son about if he felt pt would be agreeable to [facility] if recommended. Pts son states pts place is so old, she has lived there for 28 years and he needs to work on a few things in the home before someone comes in. CSW explained SNF to pts son. He states he feels this would be best for his mom however he is not sure she will want to go. However, she is alone in the home and he does not feel comfortable with her being there alone until she is stronger. CSW explained that PT will see pt, based on their recommendation CSW will speak with pt to see what her wishes are for discharge planning. TOC to follow.   Expected Discharge Plan: Skilled Nursing Facility Barriers to Discharge: Continued Medical Work up   Patient Goals and CMS Choice Patient states their goals for this hospitalization and ongoing recovery are:: Get stronger CMS Medicare.gov Compare Post Acute Care list provided to:: Patient Choice offered to / list presented to : Patient, Adult Children  Expected Discharge Plan and Services Expected Discharge Plan: Skilled Nursing Facility In-house Referral: Clinical Social Work Discharge Planning Services: CM Consult   Living arrangements for the past 2 months: Mobile Home                                      Prior Living Arrangements/Services Living arrangements for the past 2 months: Mobile  Home Lives with:: Self Patient language and need for interpreter reviewed:: Yes Do you feel safe going back to the place where you live?: Yes      Need for Family Participation in Patient Care: Yes (Comment) Care giver support system in place?: Yes (comment) Current home services: DME Criminal Activity/Legal Involvement Pertinent to Current Situation/Hospitalization: No - Comment as needed  Activities of Daily Living Home Assistive Devices/Equipment: None ADL Screening (condition at time of admission) Patient's cognitive ability adequate to safely complete daily activities?: Yes Is the patient deaf or have difficulty hearing?: No Does the patient have difficulty seeing, even when wearing glasses/contacts?: No Does the patient have difficulty concentrating, remembering, or making decisions?: No Patient able to express need for assistance with ADLs?: Yes Does the patient have difficulty dressing or bathing?: Yes Independently performs ADLs?: No Communication: Independent Dressing (OT): Needs assistance Is this a change from baseline?: Pre-admission baseline Grooming: Needs assistance Is this a change from baseline?: Pre-admission baseline Feeding: Independent Bathing: Needs assistance Is this a change from baseline?: Pre-admission baseline Toileting: Needs assistance Is this a change from baseline?: Pre-admission baseline In/Out Bed: Needs assistance Is this a change from baseline?: Pre-admission baseline Walks in Home: Needs assistance Is this a change from baseline?: Pre-admission baseline Does  the patient have difficulty walking or climbing stairs?: Yes Weakness of Legs: Left Weakness of Arms/Hands: None  Permission Sought/Granted                  Emotional Assessment Appearance:: Appears stated age       Alcohol / Substance Use: Not Applicable Psych Involvement: No (comment)  Admission diagnosis:  AKI (acute kidney injury) (HCC) [N17.9] Acute kidney injury  (HCC) [N17.9] Traumatic rhabdomyolysis, initial encounter (HCC) [T79.6XXA] Foot drop, left foot [M21.372] Patient Active Problem List   Diagnosis Date Noted   AKI (acute kidney injury) (HCC) 12/21/2021   Rhabdomyolysis 12/21/2021   Left foot drop 12/21/2021   Vitamin D deficiency disease 10/16/2019   Essential hypertension 07/29/2019   Tobacco abuse 07/29/2019   Hyperlipidemia 07/29/2019   Osteopenia 07/29/2019   Anxiety 07/29/2019   PCP:  Ponciano Ort [facility]:   [facility] - [place], [place] - [address] [place] [address] Phone: [phone] Fax: [phone]     Social Determinants of Health (SDOH) Interventions    Readmission Risk Interventions Readmission Risk Prevention Plan 12/22/2021  Medication Screening Complete  Transportation Screening Complete  Some recent data might be hidden

## 2021-12-23 DIAGNOSIS — N179 Acute kidney failure, unspecified: Secondary | ICD-10-CM | POA: Diagnosis not present

## 2021-12-23 LAB — CBC WITH DIFFERENTIAL/PLATELET
Abs Immature Granulocytes: 0.02 10*3/uL (ref 0.00–0.07)
Basophils Absolute: 0 10*3/uL (ref 0.0–0.1)
Basophils Relative: 0 %
Eosinophils Absolute: 0 10*3/uL (ref 0.0–0.5)
Eosinophils Relative: 0 %
HCT: 38.7 % (ref 36.0–46.0)
Hemoglobin: 12.3 g/dL (ref 12.0–15.0)
Immature Granulocytes: 0 %
Lymphocytes Relative: 20 %
Lymphs Abs: 1 10*3/uL (ref 0.7–4.0)
MCH: 28.9 pg (ref 26.0–34.0)
MCHC: 31.8 g/dL (ref 30.0–36.0)
MCV: 91.1 fL (ref 80.0–100.0)
Monocytes Absolute: 0.6 10*3/uL (ref 0.1–1.0)
Monocytes Relative: 11 %
Neutro Abs: 3.4 10*3/uL (ref 1.7–7.7)
Neutrophils Relative %: 69 %
Platelets: 181 10*3/uL (ref 150–400)
RBC: 4.25 MIL/uL (ref 3.87–5.11)
RDW: 13.3 % (ref 11.5–15.5)
WBC: 5 10*3/uL (ref 4.0–10.5)
nRBC: 0 % (ref 0.0–0.2)

## 2021-12-23 LAB — COMPREHENSIVE METABOLIC PANEL
ALT: 73 U/L — ABNORMAL HIGH (ref 0–44)
AST: 218 U/L — ABNORMAL HIGH (ref 15–41)
Albumin: 2.4 g/dL — ABNORMAL LOW (ref 3.5–5.0)
Alkaline Phosphatase: 47 U/L (ref 38–126)
Anion gap: 12 (ref 5–15)
BUN: 19 mg/dL (ref 8–23)
CO2: 22 mmol/L (ref 22–32)
Calcium: 8.6 mg/dL — ABNORMAL LOW (ref 8.9–10.3)
Chloride: 106 mmol/L (ref 98–111)
Creatinine, Ser: 0.77 mg/dL (ref 0.44–1.00)
GFR, Estimated: >60 mL/min (ref >60)
Glucose, Bld: 95 mg/dL (ref 70–99)
Potassium: 3.4 mmol/L — ABNORMAL LOW (ref 3.5–5.1)
Sodium: 140 mmol/L (ref 135–145)
Total Bilirubin: 0.8 mg/dL (ref 0.3–1.2)
Total Protein: 5.4 g/dL — ABNORMAL LOW (ref 6.5–8.1)

## 2021-12-23 LAB — CK
Total CK: 2194 U/L — ABNORMAL HIGH (ref 38–234)
Total CK: 1700 U/L — ABNORMAL HIGH (ref 38–234)
Total CK: 1332 U/L — ABNORMAL HIGH (ref 38–234)

## 2021-12-23 LAB — C-REACTIVE PROTEIN: CRP: 1.8 mg/dL — ABNORMAL HIGH (ref <1.0)

## 2021-12-23 LAB — D-DIMER, QUANTITATIVE: D-Dimer, Quant: 1.31 ug/mL-FEU — ABNORMAL HIGH (ref 0.00–0.50)

## 2021-12-23 MED ORDER — SODIUM CHLORIDE 0.9 % IV BOLUS
1000.0000 mL | Freq: Once | INTRAVENOUS | Status: AC
  Administered 2021-12-23: 1000 mL via INTRAVENOUS

## 2021-12-23 NOTE — Progress Notes (Signed)
Valerie Marshall is an 81 y.o. female.   Chief Complaint: [place] through the floor HPI: 81 year old female who lives alone but her son lives next door fell through her floor Friday evening and was stuck on the floor at her waist for approximately 24 hours.  When she fell her son had just checked on her for the night.  He went to work the next day tried calling her all the next day she did not answer when he got back from work he went to check on her and she was found stuck in the floor.  He called 911 and EMS came and helped her get unstuck and she refused to come to the emergency room at that time.  Since then she has been weak she has noticed her left ankle is not working right.  She has been very sore.  Her son finally made her come to the emergency room today 4 days later.  Patient found to have acute kidney injury with CK level over 6000.  Also has a left foot drop.  Orthopedic surgery has been consulted recommending splint to left foot.  No fevers no nausea vomiting diarrhea.  No abdominal pain.  No swelling in her legs.  Subjective Feeling better.  Wants to go home.  Physical therapy recommending rehab.  Results for orders placed or performed during the hospital encounter of 12/21/21 (from the past 48 hour(s))  Resp Panel by RT-PCR (Flu A&B, Covid) Nasopharyngeal Swab     Status: Abnormal   Collection Time: 12/21/21  1:31 PM   Specimen: Nasopharyngeal Swab; Nasopharyngeal(NP) swabs in vial transport medium  Result Value Ref Range   SARS Coronavirus 2 by RT PCR POSITIVE (A) NEGATIVE    Comment: (NOTE) SARS-CoV-2 target nucleic acids are DETECTED.  The SARS-CoV-2 RNA is generally detectable in upper respiratory specimens during the acute phase of infection. Positive results are indicative of the presence of the identified virus, but do not rule out bacterial infection or co-infection with other pathogens not detected by the test. Clinical correlation with patient history and other diagnostic  information is necessary to determine patient infection status. The expected result is Negative.  Fact Sheet for Patients: EntrepreneurPulse.com.au  Fact Sheet for Healthcare Providers: IncredibleEmployment.be  This test is not yet approved or cleared by the [place] FDA and  has been authorized for detection and/or diagnosis of SARS-CoV-2 by FDA under an Emergency Use Authorization (EUA).  This EUA will remain in effect (meaning this test can be used) for the duration of  the COVID-19 declaration under Section 564(b)(1) of the A ct, 21 U.S.C. section 360bbb-3(b)(1), unless the authorization is terminated or revoked sooner.     Influenza A by PCR NEGATIVE NEGATIVE   Influenza B by PCR NEGATIVE NEGATIVE    Comment: (NOTE) The Xpert Xpress SARS-CoV-2/FLU/RSV plus assay is intended as an aid in the diagnosis of influenza from Nasopharyngeal swab specimens and should not be used as a sole basis for treatment. Nasal washings and aspirates are unacceptable for Xpert Xpress SARS-CoV-2/FLU/RSV testing.  Fact Sheet for Patients: EntrepreneurPulse.com.au  Fact Sheet for Healthcare Providers: IncredibleEmployment.be  This test is not yet approved or cleared by the [place] FDA and has been authorized for detection and/or diagnosis of SARS-CoV-2 by FDA under an Emergency Use Authorization (EUA). This EUA will remain in effect (meaning this test can be used) for the duration of the COVID-19 declaration under Section 564(b)(1) of the Act, 21 U.S.C. section 360bbb-3(b)(1), unless the authorization is  terminated or revoked.  Performed at [facility], [address], [place], [place] [address]   CK     Status: Abnormal   Collection Time: 12/21/21  3:26 PM  Result Value Ref Range   Total CK 4,743 (H) 38 - 234 U/L    Comment: RESULTS CONFIRMED BY MANUAL DILUTION Performed at [facility], [address]  [place], [place] [address]   CK     Status: Abnormal   Collection Time: 12/21/21 11:30 PM  Result Value Ref Range   Total CK 4,043 (H) 38 - 234 U/L    Comment: Performed at [facility], [address], [place], [place] [id number]  Basic metabolic panel     Status: Abnormal   Collection Time: 12/22/21  5:55 AM  Result Value Ref Range   Sodium 138 135 - 145 mmol/L    Comment: DELTA CHECK NOTED   Potassium 3.8 3.5 - 5.1 mmol/L   Chloride 104 98 - 111 mmol/L   CO2 27 22 - 32 mmol/L   Glucose, Bld 90 70 - 99 mg/dL    Comment: Glucose reference range applies only to samples taken after fasting for at least 8 hours.   BUN 42 (H) 8 - 23 mg/dL   Creatinine, Ser 1.04 (H) 0.44 - 1.00 mg/dL   Calcium 8.8 (L) 8.9 - 10.3 mg/dL   GFR, Estimated 55 (L) >60 mL/min    Comment: (NOTE) Calculated using the CKD-EPI Creatinine Equation (2021)    Anion gap 7 5 - 15    Comment: Performed at [facility], [address] [place], [place] [address]  CBC     Status: None   Collection Time: 12/22/21  5:55 AM  Result Value Ref Range   WBC 4.7 4.0 - 10.5 K/uL   RBC 4.32 3.87 - 5.11 MIL/uL   Hemoglobin 12.9 12.0 - 15.0 g/dL   HCT 39.7 36.0 - 46.0 %   MCV 91.9 80.0 - 100.0 fL   MCH 29.9 26.0 - 34.0 pg   MCHC 32.5 30.0 - 36.0 g/dL   RDW 13.3 11.5 - 15.5 %   Platelets 163 150 - 400 K/uL   nRBC 0.0 0.0 - 0.2 %    Comment: Performed at [facility], [address] [place], [place] [address]  CK     Status: Abnormal   Collection Time: 12/22/21  5:55 AM  Result Value Ref Range   Total CK 4,058 (H) 38 - 234 U/L    Comment: Performed at [facility], [address], [place], [place] [address]  C-reactive protein     Status: Abnormal   Collection Time: 12/22/21  8:08 AM  Result Value Ref Range   CRP 1.9 (H) <1.0 mg/dL    Comment: Performed at [facility], [place] [address], [place], [place] [address]  D-dimer, quantitative     Status: Abnormal   Collection Time: 12/22/21  8:08 AM  Result Value Ref Range    D-Dimer, Quant 1.46 (H) 0.00 - 0.50 ug/mL-FEU    Comment: (NOTE) At the manufacturer cut-off value of 0.5 g/mL FEU, this assay has a negative predictive value of 95-100%.This assay is intended for use in conjunction with a clinical pretest probability (PTP) assessment model to exclude pulmonary embolism (PE) and deep venous thrombosis (DVT) in outpatients suspected of PE or DVT. Results should be correlated with clinical presentation. Performed at [facility], [address], [place], [place] [address]   Ferritin     Status: Abnormal  Collection Time: 12/22/21  8:08 AM  Result Value Ref Range   Ferritin 671 (H) 11 - 307 ng/mL    Comment: Performed at [facility], [address], [place], [place] [address]  Procalcitonin     Status: None   Collection Time: 12/22/21  8:08 AM  Result Value Ref Range   Procalcitonin 0.51 ng/mL    Comment:        Interpretation: PCT > 0.5 ng/mL and <= 2 ng/mL: Systemic infection (sepsis) is possible, but other conditions are known to elevate PCT as well. (NOTE)       Sepsis PCT Algorithm           Lower Respiratory Tract                                      Infection PCT Algorithm    ----------------------------     ----------------------------         PCT < 0.25 ng/mL                PCT < 0.10 ng/mL          Strongly encourage             Strongly discourage   discontinuation of antibiotics    initiation of antibiotics    ----------------------------     -----------------------------       PCT 0.25 - 0.50 ng/mL            PCT 0.10 - 0.25 ng/mL               OR       >80% decrease in PCT            Discourage initiation of                                            antibiotics      Encourage discontinuation           of antibiotics    ----------------------------     -----------------------------         PCT >= 0.50 ng/mL              PCT 0.26 - 0.50 ng/mL                AND       <80% decrease in PCT             Encourage initiation of                                              antibiotics       Encourage continuation           of antibiotics    ----------------------------     -----------------------------        PCT >= 0.50 ng/mL                  PCT > 0.50 ng/mL               AND         increase in PCT  Strongly encourage                                      initiation of antibiotics    Strongly encourage escalation           of antibiotics                                     -----------------------------                                           PCT <= 0.25 ng/mL                                                 OR                                        > 80% decrease in PCT                                      Discontinue / Do not initiate                                             antibiotics  Performed at [facility], [address], [place], [place] [address]   Fibrinogen     Status: None   Collection Time: 12/22/21  8:08 AM  Result Value Ref Range   Fibrinogen 400 210 - 475 mg/dL    Comment: (NOTE) Fibrinogen results may be underestimated in patients receiving thrombolytic therapy. Performed at [facility], [address] [place], [place] [address]   CK     Status: Abnormal   Collection Time: 12/22/21  1:40 PM  Result Value Ref Range   Total CK 4,036 (H) 38 - 234 U/L    Comment: Performed at [facility], [address], [place], [place] [address]  CK     Status: Abnormal   Collection Time: 12/22/21 10:37 PM  Result Value Ref Range   Total CK 2,932 (H) 38 - 234 U/L    Comment: Performed at [facility], [address] [place],  [address]  CBC with Differential/Platelet     Status: None   Collection Time: 12/23/21  5:18 AM  Result Value Ref Range   WBC 5.0 4.0 - 10.5 K/uL   RBC 4.25 3.87 - 5.11 MIL/uL   Hemoglobin 12.3 12.0 - 15.0 g/dL   HCT 38.7 36.0 - 46.0 %   MCV 91.1 80.0 - 100.0 fL   MCH 28.9 26.0 - 34.0 pg   MCHC 31.8 30.0 - 36.0 g/dL   RDW 13.3 11.5 - 15.5 %    Platelets 181 150 - 400 K/uL   nRBC 0.0 0.0 - 0.2 %   Neutrophils Relative % 69 %   Neutro Abs 3.4 1.7 -  7.7 K/uL   Lymphocytes Relative 20 %   Lymphs Abs 1.0 0.7 - 4.0 K/uL   Monocytes Relative 11 %   Monocytes Absolute 0.6 0.1 - 1.0 K/uL   Eosinophils Relative 0 %   Eosinophils Absolute 0.0 0.0 - 0.5 K/uL   Basophils Relative 0 %   Basophils Absolute 0.0 0.0 - 0.1 K/uL   Immature Granulocytes 0 %   Abs Immature Granulocytes 0.02 0.00 - 0.07 K/uL    Comment: Performed at [facility], [address], [place], [place] [address]  Comprehensive metabolic panel     Status: Abnormal   Collection Time: 12/23/21  5:18 AM  Result Value Ref Range   Sodium 140 135 - 145 mmol/L   Potassium 3.4 (L) 3.5 - 5.1 mmol/L   Chloride 106 98 - 111 mmol/L   CO2 22 22 - 32 mmol/L   Glucose, Bld 95 70 - 99 mg/dL    Comment: Glucose reference range applies only to samples taken after fasting for at least 8 hours.   BUN 19 8 - 23 mg/dL   Creatinine, Ser 0.77 0.44 - 1.00 mg/dL   Calcium 8.6 (L) 8.9 - 10.3 mg/dL   Total Protein 5.4 (L) 6.5 - 8.1 g/dL   Albumin 2.4 (L) 3.5 - 5.0 g/dL   AST 218 (H) 15 - 41 U/L   ALT 73 (H) 0 - 44 U/L   Alkaline Phosphatase 47 38 - 126 U/L   Total Bilirubin 0.8 0.3 - 1.2 mg/dL   GFR, Estimated >60 >60 mL/min    Comment: (NOTE) Calculated using the CKD-EPI Creatinine Equation (2021)    Anion gap 12 5 - 15    Comment: Performed at [facility], [address], [place], [place] [address]  C-reactive protein     Status: Abnormal   Collection Time: 12/23/21  5:18 AM  Result Value Ref Range   CRP 1.8 (H) <1.0 mg/dL    Comment: Performed at [facility], [place] [address] [place], [place] [address]  D-dimer, quantitative     Status: Abnormal   Collection Time: 12/23/21  5:18 AM  Result Value Ref Range   D-Dimer, Quant 1.31 (H) 0.00 - 0.50 ug/mL-FEU    Comment: (NOTE) At the manufacturer cut-off value of 0.5 g/mL FEU, this assay has a negative predictive value of  95-100%.This assay is intended for use in conjunction with a clinical pretest probability (PTP) assessment model to exclude pulmonary embolism (PE) and deep venous thrombosis (DVT) in outpatients suspected of PE or DVT. Results should be correlated with clinical presentation. Performed at [facility], [address], [place], [place] [address]   CK     Status: Abnormal   Collection Time: 12/23/21  5:18 AM  Result Value Ref Range   Total CK 2,194 (H) 38 - 234 U/L    Comment: Performed at [facility], [address] [place],  [address]   No results found.   Blood pressure (!) 125/58, pulse 89, temperature 99.4 F (37.4 C), resp. rate 18, height 5' 1.5" (1.562 m), weight 80 kg, SpO2 95 %. Physical Exam Vitals and nursing note reviewed.  Constitutional:      Appearance: Normal appearance. She is not ill-appearing.  HENT:     Head: Normocephalic and atraumatic.     Nose: No congestion.  Eyes:     Extraocular Movements: Extraocular movements intact.     Conjunctiva/sclera: Conjunctivae normal.     Pupils: Pupils are equal, round, and reactive to  light.  Cardiovascular:     Rate and Rhythm: Normal rate and regular rhythm.     Pulses: Normal pulses.     Heart sounds: Normal heart sounds. No murmur heard.   No friction rub. No gallop.  Pulmonary:     Effort: Pulmonary effort is normal. No respiratory distress.     Breath sounds: Normal breath sounds. No stridor. No wheezing, rhonchi or rales.  Abdominal:     General: Abdomen is flat. Bowel sounds are normal. There is no distension.     Palpations: Abdomen is soft.     Tenderness: There is no abdominal tenderness. There is no guarding.  Musculoskeletal:        General: No swelling, deformity or signs of injury.     Cervical back: Normal range of motion and neck supple.     Right lower leg: No edema.     Left lower leg: No edema.  Skin:    General: Skin is warm and dry.     Coloration: Skin is not jaundiced.      Findings: Bruising and lesion present.  Neurological:     Mental Status: She is alert and oriented to person, place, and time.     Cranial Nerves: No cranial nerve deficit.     Motor: Weakness present.     Comments: Left foot drop  Psychiatric:        Mood and Affect: Mood normal.        Behavior: Behavior normal.        Thought Content: Thought content normal.        Judgment: Judgment normal.     Assessment/Plan 81 year old female fell from her floor for approximately 24 hours resulting in rhabdomyolysis with acute kidney injury and left foot drop  #1.  Rhabdomyolysis with acute kidney injury-CK level over 6000 on admission down to 2000.  Creatinine bumped up to 1.66 on admit now resolved.  Continue IV fluids.  Will serial CK levels every 8 hours and continue IV fluids.  Avoid nephrotoxic substances.  Extremities without any swelling and pulses intact without any signs of compartment syndrome.  #2.  Left foot drop-likely secondary to injury to peroneal nerve no back pain.  No other focal neurological deficits.  Patient will be fitted for a AFO for foot drop per orthopedic surgery Dr. Aline Brochure recommendations  #3.  Hypertension-clarify and resume home meds  #4.  Asymptomatic COVID infection-continue course of Paxlovid  Case management working on short-term rehab bed  Patient is agreeable to this.   Further recommendations pending on overall hospital course.   Phillips Grout, MD 12/23/2021, 12:22 PM   Patient ID: Jodean Lima, female   DOB: [birth year], 81 y.o.   MRN: [id number]

## 2021-12-23 NOTE — Evaluation (Signed)
Physical Therapy Evaluation Patient Details Name: Valerie Marshall MRN: [id number] DOB: May 18, 1941 Today's Date: 12/23/2021  History of Present Illness  81 year old female who lives alone but her son lives next door fell through her floor Friday evening and was stuck on the floor at her waist for approximately 24 hours.  When she fell her son had just checked on her for the night.  He went to work the next day tried calling her all the next day she did not answer when he got back from work he went to check on her and she was found stuck in the floor.  He called 911 and EMS came and helped her get unstuck and she refused to come to the emergency room at that time.  Since then she has been weak she has noticed her left ankle is not working right.  She has been very sore.  Her son finally made her come to the emergency room today 4 days later.  Patient found to have acute kidney injury with CK level over 6000.  Also has a left foot drop.  Orthopedic surgery has been consulted recommending splint to left foot.  No fevers no nausea vomiting diarrhea.  No abdominal pain.  No swelling in her legs.   Clinical Impression  Patient limited for functional mobility as stated below secondary to BLE weakness, fatigue and poor standing balance. Patient does not require assist with bed mobility but does perform with labored movements. She demonstrates good sitting tolerance and sitting balance EOB. Patient with generalized weakness in bilateral LE with 0/5 L dorsiflexion MMT. Requires mod assist, cueing, and heavy UE support on RW for transfers and very [facility] Assisted back to bed at end of session. Patient will benefit from continued physical therapy in hospital and recommended venue below to increase strength, balance, endurance for safe ADLs and gait.        Recommendations for follow up therapy are one component of a multi-disciplinary discharge planning process, led by the attending  physician.  Recommendations may be updated based on patient status, additional functional criteria and insurance authorization.  Follow Up Recommendations Skilled nursing-short term rehab (<3 hours/day)    Assistance Recommended at Discharge Frequent or constant Supervision/Assistance  Patient can return home with the following  A lot of help with walking and/or transfers;A lot of help with bathing/dressing/bathroom;Assistance with cooking/housework;Help with stairs or ramp for entrance    Equipment Recommendations None recommended by PT  Recommendations for Other Services       Functional Status Assessment Patient has had a recent decline in their functional status and demonstrates the ability to make significant improvements in function in a reasonable and predictable amount of time.     Precautions / Restrictions Precautions Precautions: Fall Required Braces or Orthoses: Splint/Cast Splint/Cast - Date Prophylactic Dressing Applied (if applicable): 12/21/21 Restrictions Weight Bearing Restrictions: No      Mobility  Bed Mobility Overal bed mobility: Modified Independent             General bed mobility comments: slow, labored with HOB elevated    Transfers Overall transfer level: Needs assistance Equipment used: Rolling walker (2 wheels) Transfers: Sit to/from Stand, Bed to chair/wheelchair/BSC Sit to Stand: Mod assist Stand pivot transfers: Mod assist         General transfer comment: labored, requires L foot blocking so it does not slide from under her    Ambulation/Gait Ambulation/Gait assistance: Mod assist Gait Distance (Feet): 4 Feet Assistive  device: Rolling walker (2 wheels) Gait Pattern/deviations: Step-to pattern, Trunk flexed Gait velocity: decreased     General Gait Details: unsteady, heavy UE assist, cueing for proper RW use  Stairs            Wheelchair Mobility    Modified Rankin (Stroke Patients Only)       Balance Overall  balance assessment: Needs assistance Sitting-balance support: Feet supported, No upper extremity supported Sitting balance-Leahy Scale: Good Sitting balance - Comments: seated EOB   Standing balance support: Bilateral upper extremity supported, Reliant on assistive device for balance Standing balance-Leahy Scale: Poor Standing balance comment: with RW                             Pertinent Vitals/Pain Pain Assessment Pain Assessment: No/denies pain    Home Living Family/patient expects to be discharged to:: Private residence Living Arrangements: Alone Available Help at Discharge: Family;Available PRN/intermittently Type of Home: Mobile home Home Access: Stairs to enter Entrance Stairs-Rails: Right;Left;Can reach both Entrance Stairs-Number of Steps: 3-4   Home Layout: One level Home Equipment: Cane - single point      Prior Function Prior Level of Function : Independent/Modified Independent;Needs assist             Mobility Comments: Patient states household ambulator without AD mostly ADLs Comments: independent with most, son assists PRN     Hand Dominance        Extremity/Trunk Assessment   Upper Extremity Assessment Upper Extremity Assessment: Generalized weakness    Lower Extremity Assessment Lower Extremity Assessment: Generalized weakness;LLE deficits/detail LLE Deficits / Details: L ankle dorsiflexion MMT 0/5    Cervical / Trunk Assessment Cervical / Trunk Assessment: Normal  Communication   Communication: No difficulties  Cognition Arousal/Alertness: Awake/alert Behavior During Therapy: WFL for tasks assessed/performed Overall Cognitive Status: Within Functional Limits for tasks assessed                                          General Comments      Exercises     Assessment/Plan    PT Assessment Patient needs continued PT services  PT Problem List Decreased strength;Decreased mobility;Decreased activity  tolerance;Decreased balance;Decreased knowledge of use of DME       PT Treatment Interventions DME instruction;Therapeutic exercise;Gait training;Balance training;Stair training;Neuromuscular re-education;Functional mobility training;Therapeutic activities;Patient/family education    PT Goals (Current goals can be found in the Care Plan section)  Acute Rehab PT Goals Patient Stated Goal: get stronger PT Goal Formulation: With patient Time For Goal Achievement: 01/06/22 Potential to Achieve Goals: Good    Frequency Min 3X/week     Co-evaluation               AM-PAC PT "6 Clicks" Mobility  Outcome Measure Help needed turning from your back to your side while in a flat bed without using bedrails?: A Little Help needed moving from lying on your back to sitting on the side of a flat bed without using bedrails?: A Little Help needed moving to and from a bed to a chair (including a wheelchair)?: A Lot Help needed standing up from a chair using your arms (e.g., wheelchair or bedside chair)?: A Lot Help needed to walk in hospital room?: A Lot Help needed climbing 3-5 steps with a railing? : Total 6 Click Score: 13  End of Session Equipment Utilized During Treatment: Gait belt Activity Tolerance: Patient tolerated treatment well;Patient limited by fatigue Patient left: in bed;with call bell/phone within reach;with bed alarm set Nurse Communication: Mobility status PT Visit Diagnosis: Unsteadiness on feet (R26.81);Other abnormalities of gait and mobility (R26.89);History of falling (Z91.81);Muscle weakness (generalized) (M62.81)    Time: 9604-5409 PT Time Calculation (min) (ACUTE ONLY): 36 min   Charges:   PT Evaluation $PT Eval Low Complexity: 1 Low PT Treatments $Therapeutic Activity: 23-37 mins        9:46 AM, 12/23/21 Wyman Songster PT, DPT Physical Therapist at [facility]

## 2021-12-23 NOTE — NC FL2 (Signed)
[name] MEDICAID FL2 LEVEL OF CARE SCREENING TOOL     IDENTIFICATION  Patient Name: Valerie Marshall Birthdate: 05/26/1941 Sex: female Admission Date (Current Location): 12/21/2021  [facility] and [place] Number:  Valerie Marshall and Address:  Valerie Marshall,  [address], [name]      Provider Number: [id number]  Attending Physician Name and Address:  Valerie Monica, MD  Relative Name and Phone Number:       Current Level of Care: Hospital Recommended Level of Care: Skilled Nursing Facility Prior Approval Number:    Date Approved/Denied:   PASRR Number:    Discharge Plan: SNF    Current Diagnoses: Patient Active Problem List   Diagnosis Date Noted   AKI (acute kidney injury) (HCC) 12/21/2021   Rhabdomyolysis 12/21/2021   Left foot drop 12/21/2021   Vitamin D deficiency disease 10/16/2019   Essential hypertension 07/29/2019   Tobacco abuse 07/29/2019   Hyperlipidemia 07/29/2019   Osteopenia 07/29/2019   Anxiety 07/29/2019    Orientation RESPIRATION BLADDER Height & Weight     Self, Time, Situation, Place  O2 (3L) Continent, External catheter Weight: 176 lb 5.9 oz (80 kg) Height:  5' 1.5" (156.2 cm)  BEHAVIORAL SYMPTOMS/MOOD NEUROLOGICAL BOWEL NUTRITION STATUS      Continent Diet  AMBULATORY STATUS COMMUNICATION OF NEEDS Skin   Extensive Assist Verbally Skin abrasions                       Personal Care Assistance Level of Assistance  Bathing, Feeding, Dressing, Total care Bathing Assistance: Limited assistance Feeding assistance: Independent Dressing Assistance: Limited assistance Total Care Assistance: Limited assistance   Functional Limitations Info  Sight, Hearing, Speech Sight Info: Adequate Hearing Info: Impaired Speech Info: Adequate    SPECIAL CARE FACTORS FREQUENCY  PT (By licensed PT), OT (By licensed OT)     PT Frequency: 5 times weekly OT Frequency: 5 times weekly            Contractures  Contractures Info: Not present    Additional Factors Info  Code Status, Allergies Code Status Info: FULL Allergies Info: Bee venom and codeine           Current Medications (12/23/2021):  This is the current hospital active medication list Current Facility-Administered Medications  Medication Dose Route Frequency Provider Last Rate Last Admin   0.9 %  sodium chloride infusion   Intravenous Continuous Valerie Monica, MD 125 mL/hr at 12/23/21 0530 Infusion Verify at 12/23/21 0530   acetaminophen (TYLENOL) tablet 650 mg  650 mg Oral Q6H PRN Valerie Monica, MD       Or   acetaminophen (TYLENOL) suppository 650 mg  650 mg Rectal Q6H PRN Valerie Monica, MD       amLODipine (NORVASC) tablet 5 mg  5 mg Oral Daily Valerie Kos A, MD   5 mg at 12/23/21 7902   ascorbic acid (VITAMIN C) tablet 500 mg  500 mg Oral Daily Valerie Kos A, MD   500 mg at 12/23/21 4097   cholecalciferol (VITAMIN D3) tablet 5,000 Units  5,000 Units Oral Daily Valerie Kos A, MD   5,000 Units at 12/23/21 0852   enoxaparin (LOVENOX) injection 40 mg  40 mg Subcutaneous Q24H Valerie Monica, MD   40 mg at 12/23/21 3532   nirmatrelvir/ritonavir EUA (renal dosing) (PAXLOVID) 2 tablet  2 tablet Oral BID Valerie Monica, MD   2 tablet at 12/23/21 [phone]  zinc sulfate capsule 220 mg  220 mg Oral Daily Valerie Monica, MD   220 mg at 12/23/21 2595     Discharge Medications: Please see discharge summary for Marshall list of discharge medications.  Relevant Imaging Results:  Relevant Lab Results:   Additional Information SSN: [id number]77  Valerie Marshall, [name]

## 2021-12-23 NOTE — TOC Progression Note (Addendum)
Transition of Care [facility]) - Progression Note    Patient Details  Name: Valerie Marshall MRN: [id number] Date of Birth: Oct 13, 1941  [facility] CM/SW [place], [place] Phone Number: 12/23/2021, 11:31 AM  Clinical Narrative:    CSW spoke with pt about interest in SNF, pt states she is agreeable. Pt would like to go to [facility] however pt is COVID positive. Pt agreeable to [facility] referral also. CSW sent pts referral out for review. PASRR is  [id number].   [facility] accepts pt for SNF bed. They do not have a COVID room availabe today but will have one ready tomorrow. CSW updated MD of this. Pt is agreeable to accepting bed at [facility]. Pt will go to room B13 tomorrow. TOC to follow.   CSW spoke with pt about [facility] offering a bed. Pt is TOC to follow.   Expected Discharge Plan: [place] Barriers to Discharge: Continued Medical Work up  Expected Discharge Plan and Services Expected Discharge Plan: [place] In-house Referral: Clinical Social Work Discharge Planning Services: CM Consult   Living arrangements for the past 2 months: Mobile Home                                       Social Determinants of Health (SDOH) Interventions    Readmission Risk Interventions Readmission Risk Prevention Plan 12/22/2021  Medication Screening Complete  Transportation Screening Complete  Some recent data might be hidden

## 2021-12-23 NOTE — Care Management Important Message (Signed)
Important Message  Patient Details  Name: Valerie Marshall MRN: [id number] Date of Birth: 10-24-1941   Medicare Important Message Given:  Yes - Important Message mailed due to current National Emergency     Corey Harold 12/23/2021, 3:52 PM

## 2021-12-23 NOTE — Plan of Care (Signed)
°  Problem: Acute Rehab PT Goals(only PT should resolve) Goal: Patient Will Transfer Sit To/From Stand Outcome: Progressing Flowsheets (Taken 12/23/2021 0947) Patient will transfer sit to/from stand: with minimal assist Goal: Pt Will Transfer Bed To Chair/Chair To Bed Outcome: Progressing Flowsheets (Taken 12/23/2021 0947) Pt will Transfer Bed to Chair/Chair to Bed: with min assist Goal: Pt Will Ambulate Outcome: Progressing Flowsheets (Taken 12/23/2021 0947) Pt will Ambulate:  25 feet  with least restrictive assistive device  with minimal assist Goal: Pt/caregiver will Perform Home Exercise Program Outcome: Progressing Flowsheets (Taken 12/23/2021 0947) Pt/caregiver will Perform Home Exercise Program:  For increased strengthening  For improved balance  Independently  9:48 AM, 12/23/21 [place] Songster PT, DPT Physical Therapist at [facility]

## 2021-12-24 DIAGNOSIS — N179 Acute kidney failure, unspecified: Secondary | ICD-10-CM | POA: Diagnosis not present

## 2021-12-24 LAB — CK
Total CK: 998 U/L — ABNORMAL HIGH (ref 38–234)
Total CK: 1150 U/L — ABNORMAL HIGH (ref 38–234)

## 2021-12-24 LAB — CBC WITH DIFFERENTIAL/PLATELET
Abs Immature Granulocytes: 0.03 10*3/uL (ref 0.00–0.07)
Basophils Absolute: 0 10*3/uL (ref 0.0–0.1)
Basophils Relative: 0 %
Eosinophils Absolute: 0 10*3/uL (ref 0.0–0.5)
Eosinophils Relative: 1 %
HCT: 38.7 % (ref 36.0–46.0)
Hemoglobin: 12.6 g/dL (ref 12.0–15.0)
Immature Granulocytes: 1 %
Lymphocytes Relative: 35 %
Lymphs Abs: 1.6 10*3/uL (ref 0.7–4.0)
MCH: 30.1 pg (ref 26.0–34.0)
MCHC: 32.6 g/dL (ref 30.0–36.0)
MCV: 92.6 fL (ref 80.0–100.0)
Monocytes Absolute: 0.7 10*3/uL (ref 0.1–1.0)
Monocytes Relative: 16 %
Neutro Abs: 2.1 10*3/uL (ref 1.7–7.7)
Neutrophils Relative %: 47 %
Platelets: 201 10*3/uL (ref 150–400)
RBC: 4.18 MIL/uL (ref 3.87–5.11)
RDW: 13.5 % (ref 11.5–15.5)
WBC: 4.5 10*3/uL (ref 4.0–10.5)
nRBC: 0 % (ref 0.0–0.2)

## 2021-12-24 LAB — COMPREHENSIVE METABOLIC PANEL
ALT: 60 U/L — ABNORMAL HIGH (ref 0–44)
AST: 154 U/L — ABNORMAL HIGH (ref 15–41)
Albumin: 2.3 g/dL — ABNORMAL LOW (ref 3.5–5.0)
Alkaline Phosphatase: 48 U/L (ref 38–126)
Anion gap: 4 — ABNORMAL LOW (ref 5–15)
BUN: 12 mg/dL (ref 8–23)
CO2: 26 mmol/L (ref 22–32)
Calcium: 8.8 mg/dL — ABNORMAL LOW (ref 8.9–10.3)
Chloride: 109 mmol/L (ref 98–111)
Creatinine, Ser: 0.73 mg/dL (ref 0.44–1.00)
GFR, Estimated: >60 mL/min (ref >60)
Glucose, Bld: 82 mg/dL (ref 70–99)
Potassium: 3.1 mmol/L — ABNORMAL LOW (ref 3.5–5.1)
Sodium: 139 mmol/L (ref 135–145)
Total Bilirubin: 0.7 mg/dL (ref 0.3–1.2)
Total Protein: 5.5 g/dL — ABNORMAL LOW (ref 6.5–8.1)

## 2021-12-24 LAB — MAGNESIUM: Magnesium: 1.5 mg/dL — ABNORMAL LOW (ref 1.7–2.4)

## 2021-12-24 LAB — C-REACTIVE PROTEIN: CRP: 2.5 mg/dL — ABNORMAL HIGH (ref <1.0)

## 2021-12-24 LAB — D-DIMER, QUANTITATIVE: D-Dimer, Quant: 1.42 ug/mL-FEU — ABNORMAL HIGH (ref 0.00–0.50)

## 2021-12-24 MED ORDER — ASCORBIC ACID 500 MG PO TABS: 500.0000 mg | ORAL_TABLET | Freq: Every day | ORAL | 0 refills | Status: AC

## 2021-12-24 MED ORDER — NIRMATRELVIR/RITONAVIR (PAXLOVID) TABLET (RENAL DOSING): 2.0000 | ORAL_TABLET | Freq: Two times a day (BID) | ORAL | 0 refills | Status: AC

## 2021-12-24 MED ORDER — ZINC SULFATE 220 (50 ZN) MG PO CAPS
220.0000 mg | ORAL_CAPSULE | Freq: Every day | ORAL | 0 refills | Status: AC
Start: 2021-12-24 — End: 2022-01-08

## 2021-12-24 MED ORDER — POTASSIUM CHLORIDE CRYS ER 20 MEQ PO TBCR
40.0000 meq | EXTENDED_RELEASE_TABLET | Freq: Every day | ORAL | Status: DC
  Administered 2021-12-24: 40 meq via ORAL
  Filled 2021-12-24: qty 2

## 2021-12-24 NOTE — Discharge Summary (Signed)
Physician Discharge Summary  Patient ID: Valerie Marshall MRN: [id number] DOB/AGE: 06/29/1941 81 y.o.  Admit date: 12/21/2021 Discharge date: 12/24/2021  Admission Diagnoses:  Discharge Diagnoses:  Principal Problem:   AKI (acute kidney injury) ([place]) Active Problems:   Rhabdomyolysis   Left foot drop   Essential hypertension   Discharged Condition: stable  Hospital Course: HPI: 81 year old female who lives alone but her son lives next door fell through her floor Friday evening and was stuck on the floor at her waist for approximately 24 hours.  When she fell her son had just checked on her for the night.  He went to work the next day tried calling her all the next day she did not answer when he got back from work he went to check on her and she was found stuck in the floor.  He called 911 and EMS came and helped her get unstuck and she refused to come to the emergency room at that time.  Since then she has been weak she has noticed her left ankle is not working right.  She has been very sore.  Her son finally made her come to the emergency room today 4 days later.  Patient found to have acute kidney injury with CK level over 6000.  Also has a left foot drop.  Orthopedic surgery has been consulted recommending splint to left foot.  No fevers no nausea vomiting diarrhea.  No abdominal pain.  No swelling in her legs.  Patient was admitted provide IV fluids her rhabdo resolved along with her renal function.  She will be discharged to follow-up with a primary care physician in 1 to 2 weeks and orthopedic surgery in 2 to 4 weeks as below.  #1.  Rhabdomyolysis with acute kidney injury-CK level over 6000 on admission down to 100.  Creatinine bumped up to 1.66 on admit now resolved.  Extremities without any swelling and pulses intact without any signs of compartment syndrome.  Patient will need repeat CK levels and creatinine in a week with her primary care physician to ensure continued resolution.    #2.  Left foot drop-likely secondary to injury to peroneal nerve no back pain.  No other focal neurological deficits.  Patient will be fitted for a AFO for foot drop per orthopedic surgery Dr. Aline Brochure recommendations.  Follow-up with orthopedic surgery 2 to 4 weeks   #3.  Hypertension-resume home meds   #4.  Asymptomatic COVID infection-continue course of Paxlovid for another 2 days      Discharge Exam: Blood pressure 127/75, pulse 76, temperature 98.3 F (36.8 C), temperature source Oral, resp. rate 17, height 5' 1.5" (1.562 m), weight 80 kg, SpO2 90 %. General appearance: alert and cooperative Neck: no adenopathy, no carotid bruit, no JVD, supple, symmetrical, trachea midline, and thyroid not enlarged, symmetric, no tenderness/mass/nodules Resp: clear to auscultation bilaterally Cardio: regular rate and rhythm, S1, S2 normal, no murmur, click, rub or gallop GI: soft, non-tender; bowel sounds normal; no masses,  no organomegaly Extremities: extremities normal, atraumatic, no cyanosis or edema Pulses: 2+ and symmetric  Disposition:    Allergies as of 12/24/2021       Reactions   Bee Venom Swelling   Codeine Other (See Comments)   Nervousness, Hallucinations        Medication List     TAKE these medications    amLODipine 5 MG tablet Commonly known as: NORVASC TAKE 1 TABLET BY MOUTH ONCE A DAY.   ascorbic acid 500 MG tablet  Commonly known as: VITAMIN C Take 1 tablet (500 mg total) by mouth daily. Start taking on: December 25, 2021   clorazepate 7.5 MG tablet Commonly known as: TRANXENE Take 1 tablet (7.5 mg total) by mouth 2 (two) times daily as needed.   lisinopril 20 MG tablet Commonly known as: ZESTRIL Take 1 tablet (20 mg total) by mouth daily.   nirmatrelvir/ritonavir EUA (renal dosing) 10 x 150 MG & 10 x 100MG  Tabs Commonly known as: PAXLOVID Take 2 tablets by mouth 2 (two) times daily for 2 days. Patient GFR is normal. Take nirmatrelvir (150 mg) one  tablet twice daily for 5 days and ritonavir (100 mg) one tablet twice daily for 5 days.   potassium chloride SA 20 MEQ tablet Commonly known as: KLOR-CON M TAKE (1) TABLET BY MOUTH TWICE DAILY.   simvastatin 10 MG tablet Commonly known as: ZOCOR Take 10 mg by mouth daily.   Vitamin D-3 125 MCG (5000 UT) Tabs Take 2 tablets by mouth daily.   zinc sulfate 220 (50 Zn) MG capsule Take 1 capsule (220 mg total) by mouth daily for 15 days.         Signed: [name] A 12/24/2021, 10:16 AM

## 2021-12-24 NOTE — Care Management Important Message (Signed)
Important Message  Patient Details  Name: Valerie Marshall MRN: [id number] Date of Birth: 06/10/1941   Medicare Important Message Given:  Yes     Iona Beard, [place] 12/24/2021, 1:48 PM

## 2021-12-24 NOTE — TOC Transition Note (Signed)
Transition of Care [facility]) - CM/SW Discharge Note   Patient Details  Name: Valerie Marshall MRN: [id number] Date of Birth: December 01, 1940  [facility] CM/SW Contact:  [name], LCSWA Phone Number: 12/24/2021, 1:43 PM   Clinical Narrative:    CSW updated by Eunice Blase at [facility] that they can accept pt for admission today. CSW updated RN of pts room number and number for report. CSW updated pts son Ethelene Browns of discharge to facility today. CSW to set pt up with [facility] transport ride. TOC signing off.   Final next level of care: Skilled Nursing Facility Barriers to Discharge: No Barriers Identified   Patient Goals and CMS Choice Patient states their goals for this hospitalization and ongoing recovery are:: Go to SNF CMS Medicare.gov Compare Post Acute Care list provided to:: Patient Choice offered to / list presented to : Patient, Adult Children  Discharge Placement              Patient chooses bed at: Other - please specify in the comment section below: [facility]) Patient to be transferred to facility by: [facility] transport Name of family member notified: Keely Drennan Patient and family notified of of transfer: 12/24/21  Discharge Plan and Services In-house Referral: Clinical Social Work Discharge Planning Services: CM Consult                                 Social Determinants of Health (SDOH) Interventions     Readmission Risk Interventions Readmission Risk Prevention Plan 12/22/2021  Medication Screening Complete  Transportation Screening Complete  Some recent data might be hidden

## 2021-12-24 NOTE — Progress Notes (Signed)
IV removed and discharge papers sent with patient by [facility] transport.  Before discharge, patient was fitted with AFO device for foot drop and had it on on discharge.  Report called to Alona Bene, nurse and [facility] and patient taken by [facility] to [place].  Son has been at bedside

## 2021-12-24 NOTE — Progress Notes (Signed)
Telemetry called, Patient had 5 runs of V-tach. Notified MD, no new orders at this time. Will continue to monitor.

## 2022-01-06 ENCOUNTER — Telehealth: Payer: Self-pay | Admitting: Orthopedic Surgery

## 2022-01-06 NOTE — Telephone Encounter (Signed)
Please review Emergency room visit 12/21/21 - per request for appointment; spoke with Janelle Floor at [facility] facility - ph [phone]; please advise.

## 2022-01-06 NOTE — Telephone Encounter (Signed)
Called back to [facility] facility - no answer at direct phone for Janelle Floor; called main number [phone] to request to transfer; still not available. I left message, scheduled patient accordingly, and faxed the appointment information, per wendy's response, to facility at [phone].

## 2022-01-12 ENCOUNTER — Ambulatory Visit: Payer: Medicare Other | Admitting: Orthopedic Surgery

## 2022-12-25 ENCOUNTER — Ambulatory Visit
Admission: EM | Admit: 2022-12-25 | Discharge: 2022-12-25 | Disposition: A | Discharge status: Home / Self Care | Payer: Medicare Other | Attending: Nurse Practitioner | Admitting: Nurse Practitioner

## 2022-12-25 DIAGNOSIS — A084 Viral intestinal infection, unspecified: Secondary | ICD-10-CM | POA: Diagnosis not present

## 2022-12-25 MED ORDER — ONDANSETRON 4 MG PO TBDP
4.0000 mg | ORAL_TABLET | Freq: Three times a day (TID) | ORAL | 0 refills | Status: AC | PRN
Start: 2022-12-25 — End: ?

## 2022-12-25 NOTE — Discharge Instructions (Addendum)
It sounds like you have a stomach bug.  As we discussed, make sure you are drinking plenty of fluids.  This includes water and electrolyte solutions like Pedialyte or sugar-free Gatorade/Powerade.  You can take the nausea medicine every 8 hours as needed if nausea/vomiting develops.  Starting later today or early tomorrow, you can start back eating soft/easy to digest foods.  If you develop blood in your stool or if your symptoms worsen, please return to be seen.

## 2022-12-25 NOTE — ED Provider Notes (Signed)
[facility]    CSN: [id number] Arrival date & time: 12/25/22  0820      History   Chief Complaint Chief Complaint  Patient presents with   Diarrhea    HPI MEKA LEWAN is a 82 y.o. female.   Patient presents today for 3-day history of nonbloody diarrhea.  She describes the diarrhea as pure water, however it did have some formed pieces in it.  She reports 0 episodes of diarrhea so far today.  No current abdominal pain, nausea or vomiting.  Denies fever, bodyaches or chills.  Reports appetite has been decreased because whenever she has eaten anything the past few days, it has "gone right through me."  Reports that she did have some sharp abdominal pain prior to the diarrhea, however this has now resolved.  No loss of taste or smell.  No recent cough, congestion, or sore throat.  No recent foreign travel.  Reports she is tolerating liquids well and started drinking Pedialyte and sugar-free Gatorade yesterday which really helped her feel better per her report.  Reports her sister and son also recently had similar symptoms.  Symptoms are improved from when they first started per her report.     Past Medical History:  Diagnosis Date   Anxiety    Hemorrhoids    Hernia of abdominal cavity    Hyperlipidemia    Hypertension    Irritable bowel syndrome    Osteopenia    Vitamin D deficiency disease 10/16/2019    Patient Active Problem List   Diagnosis Date Noted   AKI (acute kidney injury) (HCC) 12/21/2021   Rhabdomyolysis 12/21/2021   Left foot drop 12/21/2021   Vitamin D deficiency disease 10/16/2019   Essential hypertension 07/29/2019   Tobacco abuse 07/29/2019   Hyperlipidemia 07/29/2019   Osteopenia 07/29/2019   Anxiety 07/29/2019    Past Surgical History:  Procedure Laterality Date   EYE SURGERY Bilateral    lasix    NECK SURGERY      OB History   No obstetric history on file.      Home Medications    Prior to Admission medications    Medication Sig Start Date End Date Taking? Authorizing Provider  ondansetron (ZOFRAN-ODT) 4 MG disintegrating tablet Take 1 tablet (4 mg total) by mouth every 8 (eight) hours as needed for nausea or vomiting. 12/25/22  Yes Cathlean Marseilles A, NP  amLODipine (NORVASC) 5 MG tablet TAKE 1 TABLET BY MOUTH ONCE A DAY. 06/30/21   Elenore Paddy, NP  ascorbic acid (VITAMIN C) 500 MG tablet Take 1 tablet (500 mg total) by mouth daily. 12/25/21   Haydee Monica, MD  Cholecalciferol (VITAMIN D-3) 125 MCG (5000 UT) TABS Take 2 tablets by mouth daily.    [provider]  clorazepate (TRANXENE) 7.5 MG tablet Take 1 tablet (7.5 mg total) by mouth 2 (two) times daily as needed. 05/11/21   Lilly Cove C, MD  lisinopril (ZESTRIL) 20 MG tablet Take 1 tablet (20 mg total) by mouth daily. 07/21/21   Elenore Paddy, NP  potassium chloride SA (KLOR-CON) 20 MEQ tablet TAKE (1) TABLET BY MOUTH TWICE DAILY. 07/20/21   Elenore Paddy, NP  simvastatin (ZOCOR) 10 MG tablet Take 10 mg by mouth daily. 09/28/21   [provider]    Family History Family History  Problem Relation Age of Onset   Breast cancer Sister    Diabetes Sister    Colon cancer Neg Hx    Colon  polyps Neg Hx    Kidney disease Neg Hx    Esophageal cancer Neg Hx    Gallbladder disease Neg Hx     Social History Social History   Tobacco Use   Smoking status: Every Day    Packs/day: 0.50    Years: 45.00    Total pack years: 22.50    Types: Cigarettes   Smokeless tobacco: Never  Vaping Use   Vaping Use: Never used  Substance Use Topics   Alcohol use: No    Alcohol/week: 0.0 standard drinks of alcohol   Drug use: No     Allergies   Bee venom and Codeine   Review of Systems Review of Systems Per HPI  Physical Exam Triage Vital Signs ED Triage Vitals  Enc Vitals Group     BP 12/25/22 0933 (!) 158/72     Pulse Rate 12/25/22 0933 81     Resp 12/25/22 0933 20     Temp 12/25/22 0933 98.4 F (36.9 C)     Temp Source  12/25/22 0933 Oral     SpO2 12/25/22 0933 90 %     Weight --      Height --      Head Circumference --      Peak Flow --      Pain Score 12/25/22 0932 0     Pain Loc --      Pain Edu? --      Excl. in [place]? --    No data found.  Updated Vital Signs BP (!) 158/72 (BP Location: Right Arm)   Pulse 81   Temp 98.4 F (36.9 C) (Oral)   Resp 20   SpO2 90%   Oxygen saturation recheck on room air: 95%  Visual Acuity Right Eye Distance:   Left Eye Distance:   Bilateral Distance:    Right Eye Near:   Left Eye Near:    Bilateral Near:     Physical Exam Vitals and nursing note reviewed.  Constitutional:      General: She is not in acute distress.    Appearance: Normal appearance. She is not toxic-appearing.  HENT:     Right Ear: Tympanic membrane, ear canal and external ear normal. There is no impacted cerumen.     Left Ear: Tympanic membrane, ear canal and external ear normal. There is no impacted cerumen.     Mouth/Throat:     Mouth: Mucous membranes are moist.     Pharynx: Oropharynx is clear. No posterior oropharyngeal erythema.  Cardiovascular:     Rate and Rhythm: Normal rate and regular rhythm.  Pulmonary:     Effort: Pulmonary effort is normal. No respiratory distress.     Breath sounds: Normal breath sounds. No wheezing, rhonchi or rales.  Abdominal:     General: Abdomen is flat. Bowel sounds are normal. There is no distension.     Palpations: Abdomen is soft.     Tenderness: There is no abdominal tenderness. There is no right CVA tenderness, left CVA tenderness, guarding or rebound. Negative signs include Murphy's sign, Rovsing's sign and McBurney's sign.  Musculoskeletal:     Cervical back: Normal range of motion.  Lymphadenopathy:     Cervical: No cervical adenopathy.  Skin:    General: Skin is warm and dry.     Capillary Refill: Capillary refill takes less than 2 seconds.     Coloration: Skin is not jaundiced or pale.     Findings: No erythema.   Neurological:  Mental Status: She is alert and oriented to person, place, and time.  Psychiatric:        Behavior: Behavior is cooperative.      UC Treatments / Results  Labs (all labs ordered are listed, but only abnormal results are displayed) Labs Reviewed - No data to display  EKG   Radiology No results found.  Procedures Procedures (including critical care time)  Medications Ordered in UC Medications - No data to display  Initial Impression / Assessment and Plan / UC Course  I have reviewed the triage vital signs and the nursing notes.  Pertinent labs & imaging results that were available during my care of the patient were reviewed by me and considered in my medical decision making (see chart for details).   Patient is well-appearing, afebrile, not tachycardic, not tachypneic, oxygenating well on room air.  She is mildly hypertensive today in triage.  Viral gastroenteritis Suspect viral gastroenteritis No red flags in history or on exam today It sounds like symptoms have improved Supportive care discussed Start Zofran as needed for nausea/vomiting Recommended increasing hydration, full liquid diet then soft diet as she transitions back into eating her regular foods Strict ER and return precautions discussed with patient  The patient was given the opportunity to ask questions.  All questions answered to their satisfaction.  The patient is in agreement to this plan.    Final Clinical Impressions(s) / UC Diagnoses   Final diagnoses:  Viral gastroenteritis     Discharge Instructions      It sounds like you have a stomach bug.  As we discussed, make sure you are drinking plenty of fluids.  This includes water and electrolyte solutions like Pedialyte or sugar-free Gatorade/Powerade.  You can take the nausea medicine every 8 hours as needed if nausea/vomiting develops.  Starting later today or early tomorrow, you can start back eating soft/easy to digest foods.   If you develop blood in your stool or if your symptoms worsen, please return to be seen.     ED Prescriptions     Medication Sig Dispense Auth. Provider   ondansetron (ZOFRAN-ODT) 4 MG disintegrating tablet Take 1 tablet (4 mg total) by mouth every 8 (eight) hours as needed for nausea or vomiting. 20 tablet Eulogio Bear, NP      PDMP not reviewed this encounter.   Eulogio Bear, NP 12/25/22 1126

## 2022-12-25 NOTE — ED Triage Notes (Signed)
Pt reports diarrhea x 3 days. Pedialyte and Gatorade gives some relief.

## 2023-09-27 ENCOUNTER — Other Ambulatory Visit (HOSPITAL_COMMUNITY): Payer: Self-pay | Admitting: Adult Health

## 2023-09-27 DIAGNOSIS — Z13820 Encounter for screening for osteoporosis: Secondary | ICD-10-CM
